# Patient Record
Sex: Male | Born: 1935 | Race: White | Hispanic: No | Marital: Married | Smoking: Former smoker
Health system: Southern US, Community
[De-identification: ages and names within clinical notes are randomized; demographics above are authoritative.]

## PROBLEM LIST (undated history)

## (undated) DIAGNOSIS — I4891 Unspecified atrial fibrillation: Secondary | ICD-10-CM

## (undated) DIAGNOSIS — I10 Essential (primary) hypertension: Secondary | ICD-10-CM

## (undated) DIAGNOSIS — H9190 Unspecified hearing loss, unspecified ear: Secondary | ICD-10-CM

## (undated) HISTORY — PX: LUNG BIOPSY: SHX232

---

## 1998-09-29 ENCOUNTER — Inpatient Hospital Stay (HOSPITAL_COMMUNITY): Admission: EM | Admit: 1998-09-29 | Discharge: 1998-10-03 | Payer: Self-pay | Admitting: Emergency Medicine

## 1998-10-11 ENCOUNTER — Encounter (HOSPITAL_COMMUNITY): Payer: Self-pay | Admitting: Oncology

## 1998-10-11 ENCOUNTER — Ambulatory Visit (HOSPITAL_COMMUNITY): Admission: RE | Admit: 1998-10-11 | Discharge: 1998-10-11 | Payer: Self-pay | Admitting: Oncology

## 2000-08-26 ENCOUNTER — Emergency Department (HOSPITAL_COMMUNITY): Admission: EM | Admit: 2000-08-26 | Discharge: 2000-08-26 | Payer: Self-pay | Admitting: Internal Medicine

## 2001-03-16 ENCOUNTER — Emergency Department (HOSPITAL_COMMUNITY): Admission: EM | Admit: 2001-03-16 | Discharge: 2001-03-16 | Payer: Self-pay | Admitting: Emergency Medicine

## 2001-03-16 ENCOUNTER — Encounter: Payer: Self-pay | Admitting: Emergency Medicine

## 2001-04-09 ENCOUNTER — Encounter: Admission: RE | Admit: 2001-04-09 | Discharge: 2001-04-09 | Payer: Self-pay | Admitting: *Deleted

## 2001-04-09 ENCOUNTER — Encounter: Payer: Self-pay | Admitting: *Deleted

## 2001-06-03 ENCOUNTER — Ambulatory Visit (HOSPITAL_COMMUNITY): Admission: RE | Admit: 2001-06-03 | Discharge: 2001-06-03 | Payer: Self-pay | Admitting: *Deleted

## 2006-04-15 ENCOUNTER — Encounter: Payer: Self-pay | Admitting: Emergency Medicine

## 2006-06-05 ENCOUNTER — Encounter: Admission: RE | Admit: 2006-06-05 | Discharge: 2006-06-05 | Payer: Self-pay | Admitting: Internal Medicine

## 2017-06-17 ENCOUNTER — Emergency Department (HOSPITAL_COMMUNITY): Payer: Medicare Other

## 2017-06-17 ENCOUNTER — Inpatient Hospital Stay (HOSPITAL_COMMUNITY)
Admission: EM | Admit: 2017-06-17 | Discharge: 2017-06-22 | DRG: 871 | Disposition: A | Discharge status: Skilled Nursing Facility | Payer: Medicare Other | Attending: Internal Medicine | Admitting: Internal Medicine

## 2017-06-17 ENCOUNTER — Encounter (HOSPITAL_COMMUNITY): Payer: Self-pay | Admitting: Obstetrics and Gynecology

## 2017-06-17 DIAGNOSIS — E871 Hypo-osmolality and hyponatremia: Secondary | ICD-10-CM | POA: Diagnosis present

## 2017-06-17 DIAGNOSIS — I10 Essential (primary) hypertension: Secondary | ICD-10-CM | POA: Diagnosis present

## 2017-06-17 DIAGNOSIS — J189 Pneumonia, unspecified organism: Secondary | ICD-10-CM | POA: Diagnosis present

## 2017-06-17 DIAGNOSIS — E872 Acidosis: Secondary | ICD-10-CM | POA: Diagnosis present

## 2017-06-17 DIAGNOSIS — K5900 Constipation, unspecified: Secondary | ICD-10-CM | POA: Diagnosis present

## 2017-06-17 DIAGNOSIS — R319 Hematuria, unspecified: Secondary | ICD-10-CM | POA: Diagnosis present

## 2017-06-17 DIAGNOSIS — Z66 Do not resuscitate: Secondary | ICD-10-CM | POA: Diagnosis present

## 2017-06-17 DIAGNOSIS — I248 Other forms of acute ischemic heart disease: Secondary | ICD-10-CM | POA: Diagnosis present

## 2017-06-17 DIAGNOSIS — Z79899 Other long term (current) drug therapy: Secondary | ICD-10-CM | POA: Diagnosis not present

## 2017-06-17 DIAGNOSIS — R778 Other specified abnormalities of plasma proteins: Secondary | ICD-10-CM | POA: Diagnosis present

## 2017-06-17 DIAGNOSIS — E876 Hypokalemia: Secondary | ICD-10-CM | POA: Diagnosis not present

## 2017-06-17 DIAGNOSIS — A419 Sepsis, unspecified organism: Secondary | ICD-10-CM | POA: Diagnosis present

## 2017-06-17 DIAGNOSIS — N390 Urinary tract infection, site not specified: Secondary | ICD-10-CM | POA: Diagnosis present

## 2017-06-17 DIAGNOSIS — R7989 Other specified abnormal findings of blood chemistry: Secondary | ICD-10-CM

## 2017-06-17 DIAGNOSIS — R748 Abnormal levels of other serum enzymes: Secondary | ICD-10-CM | POA: Diagnosis not present

## 2017-06-17 DIAGNOSIS — Z87891 Personal history of nicotine dependence: Secondary | ICD-10-CM

## 2017-06-17 DIAGNOSIS — N179 Acute kidney failure, unspecified: Secondary | ICD-10-CM | POA: Diagnosis present

## 2017-06-17 DIAGNOSIS — J9601 Acute respiratory failure with hypoxia: Secondary | ICD-10-CM

## 2017-06-17 DIAGNOSIS — J9621 Acute and chronic respiratory failure with hypoxia: Secondary | ICD-10-CM | POA: Diagnosis present

## 2017-06-17 DIAGNOSIS — E861 Hypovolemia: Secondary | ICD-10-CM | POA: Diagnosis present

## 2017-06-17 HISTORY — DX: Essential (primary) hypertension: I10

## 2017-06-17 LAB — CBC WITH DIFFERENTIAL/PLATELET
Basophils Absolute: 0 10*3/uL (ref 0.0–0.1)
Basophils Relative: 0 %
EOS ABS: 0 10*3/uL (ref 0.0–0.7)
EOS PCT: 0 %
HCT: 44 % (ref 39.0–52.0)
HEMOGLOBIN: 15.7 g/dL (ref 13.0–17.0)
LYMPHS ABS: 1.5 10*3/uL (ref 0.7–4.0)
LYMPHS PCT: 7 %
MCH: 32.6 pg (ref 26.0–34.0)
MCHC: 35.7 g/dL (ref 30.0–36.0)
MCV: 91.5 fL (ref 78.0–100.0)
MONOS PCT: 6 %
Monocytes Absolute: 1.2 10*3/uL — ABNORMAL HIGH (ref 0.1–1.0)
Neutro Abs: 18.2 10*3/uL — ABNORMAL HIGH (ref 1.7–7.7)
Neutrophils Relative %: 87 %
Platelets: 299 10*3/uL (ref 150–400)
RBC: 4.81 MIL/uL (ref 4.22–5.81)
RDW: 13 % (ref 11.5–15.5)
WBC: 20.9 10*3/uL — ABNORMAL HIGH (ref 4.0–10.5)

## 2017-06-17 LAB — COMPREHENSIVE METABOLIC PANEL
ALBUMIN: 3 g/dL — AB (ref 3.5–5.0)
ALT: 18 U/L (ref 17–63)
AST: 29 U/L (ref 15–41)
Alkaline Phosphatase: 132 U/L — ABNORMAL HIGH (ref 38–126)
Anion gap: 14 (ref 5–15)
BUN: 90 mg/dL — AB (ref 6–20)
CHLORIDE: 99 mmol/L — AB (ref 101–111)
CO2: 19 mmol/L — AB (ref 22–32)
CREATININE: 3.04 mg/dL — AB (ref 0.61–1.24)
Calcium: 8.7 mg/dL — ABNORMAL LOW (ref 8.9–10.3)
GFR calc Af Amer: 19 mL/min — ABNORMAL LOW (ref >60)
GFR calc non Af Amer: 16 mL/min — ABNORMAL LOW (ref >60)
Glucose, Bld: 109 mg/dL — ABNORMAL HIGH (ref 65–99)
POTASSIUM: 3.6 mmol/L (ref 3.5–5.1)
SODIUM: 132 mmol/L — AB (ref 135–145)
Total Bilirubin: 1.3 mg/dL — ABNORMAL HIGH (ref 0.3–1.2)
Total Protein: 7.4 g/dL (ref 6.5–8.1)

## 2017-06-17 LAB — I-STAT TROPONIN, ED: TROPONIN I, POC: 0.1 ng/mL — AB (ref 0.00–0.08)

## 2017-06-17 LAB — URINALYSIS, MICROSCOPIC (REFLEX): SQUAMOUS EPITHELIAL / LPF: NONE SEEN

## 2017-06-17 LAB — URINALYSIS, ROUTINE W REFLEX MICROSCOPIC
Glucose, UA: NEGATIVE mg/dL
KETONES UR: NEGATIVE mg/dL
NITRITE: NEGATIVE
PH: 5 (ref 5.0–8.0)
PROTEIN: 100 mg/dL — AB
Specific Gravity, Urine: 1.02 (ref 1.005–1.030)

## 2017-06-17 LAB — TROPONIN I: Troponin I: 0.05 ng/mL (ref <0.03)

## 2017-06-17 LAB — LACTIC ACID, PLASMA: Lactic Acid, Venous: 1.7 mmol/L (ref 0.5–1.9)

## 2017-06-17 LAB — I-STAT CG4 LACTIC ACID, ED: LACTIC ACID, VENOUS: 2 mmol/L — AB (ref 0.5–1.9)

## 2017-06-17 LAB — BRAIN NATRIURETIC PEPTIDE: B Natriuretic Peptide: 744.7 pg/mL — ABNORMAL HIGH (ref 0.0–100.0)

## 2017-06-17 MED ORDER — DEXTROSE 5 % IV SOLN
500.0000 mg | INTRAVENOUS | Status: DC
  Administered 2017-06-17 – 2017-06-19 (×3): 500 mg via INTRAVENOUS
  Filled 2017-06-17 (×3): qty 500

## 2017-06-17 MED ORDER — VANCOMYCIN HCL IN DEXTROSE 1-5 GM/200ML-% IV SOLN
1000.0000 mg | Freq: Once | INTRAVENOUS | Status: AC
  Administered 2017-06-17: 1000 mg via INTRAVENOUS
  Filled 2017-06-17: qty 200

## 2017-06-17 MED ORDER — SODIUM CHLORIDE 0.9 % IV BOLUS (SEPSIS)
1000.0000 mL | Freq: Once | INTRAVENOUS | Status: AC
  Administered 2017-06-17: 1000 mL via INTRAVENOUS

## 2017-06-17 MED ORDER — PIPERACILLIN-TAZOBACTAM IN DEX 2-0.25 GM/50ML IV SOLN
2.2500 g | Freq: Three times a day (TID) | INTRAVENOUS | Status: DC
  Filled 2017-06-17: qty 50

## 2017-06-17 MED ORDER — IPRATROPIUM-ALBUTEROL 0.5-2.5 (3) MG/3ML IN SOLN
RESPIRATORY_TRACT | Status: AC
  Filled 2017-06-17: qty 3

## 2017-06-17 MED ORDER — IPRATROPIUM-ALBUTEROL 0.5-2.5 (3) MG/3ML IN SOLN
3.0000 mL | Freq: Once | RESPIRATORY_TRACT | Status: AC
  Administered 2017-06-17: 3 mL via RESPIRATORY_TRACT

## 2017-06-17 MED ORDER — PIPERACILLIN-TAZOBACTAM 3.375 G IVPB 30 MIN
3.3750 g | Freq: Once | INTRAVENOUS | Status: AC
  Administered 2017-06-17: 3.375 g via INTRAVENOUS
  Filled 2017-06-17: qty 50

## 2017-06-17 MED ORDER — DEXTROSE 5 % IV SOLN
1.0000 g | INTRAVENOUS | Status: DC
  Administered 2017-06-17 – 2017-06-21 (×4): 1 g via INTRAVENOUS
  Filled 2017-06-17 (×7): qty 10

## 2017-06-17 MED ORDER — ACETAMINOPHEN 500 MG PO TABS
1000.0000 mg | ORAL_TABLET | Freq: Once | ORAL | Status: AC
Start: 2017-06-17 — End: 2017-06-17
  Administered 2017-06-17: 1000 mg via ORAL
  Filled 2017-06-17: qty 2

## 2017-06-17 MED ORDER — HEPARIN SODIUM (PORCINE) 5000 UNIT/ML IJ SOLN
5000.0000 [IU] | Freq: Three times a day (TID) | INTRAMUSCULAR | Status: DC
  Administered 2017-06-17 – 2017-06-21 (×13): 5000 [IU] via SUBCUTANEOUS
  Filled 2017-06-17 (×14): qty 1

## 2017-06-17 MED ORDER — VANCOMYCIN HCL IN DEXTROSE 1-5 GM/200ML-% IV SOLN: 1000.0000 mg | INTRAVENOUS | Status: DC

## 2017-06-17 MED ORDER — SODIUM CHLORIDE 0.9 % IV SOLN
INTRAVENOUS | Status: DC
  Administered 2017-06-17: 21:00:00 via INTRAVENOUS

## 2017-06-17 NOTE — ED Notes (Signed)
Respiratory at bedside.

## 2017-06-17 NOTE — Progress Notes (Signed)
CRITICAL VALUE ALERT  Critical Value:  Troponin 0.05  Date & Time Notied:  06/17/17 2310  Provider Notified: Kirtland BouchardK. Schorr  Orders Received/Actions taken: none given, will continue to monitor

## 2017-06-17 NOTE — Progress Notes (Signed)
Pharmacy Antibiotic Note  Royanne FootsLucius W Inda is a 83 y.o. male admitted on 06/17/2017 with sepsis.  Presenting complaint was shortness of breath.  Pharmacy has been consulted for vancomycin and zosyn dosing.  Vanc 1g x 1 and Zosyn 3.375g x 1 given as first time doses in the ED.  SCr 3.04, CrCl~14 ml/min CXR: Bibasilar airspace disease, left greater than right, suspicious for pneumonia.  Plan: Zosyn 2.25g IV q8h. Vancomycin 1g IV q48h. Daily SCr.  Weight: 148 lb (67.1 kg)  Temp (24hrs), Avg:102 F (38.9 C), Min:102 F (38.9 C), Max:102 F (38.9 C)   Recent Labs Lab 06/17/17 1708 06/17/17 1715  WBC 20.9*  --   CREATININE 3.04*  --   LATICACIDVEN  --  2.00*    CrCl cannot be calculated (Unknown ideal weight.).    Not on File  Antimicrobials this admission: 7/4 vancomycin >>  7/4 zosyn >>   Dose adjustments this admission:  Microbiology results: 7/4 BCx:  7/4 UCx:    Thank you for allowing pharmacy to be a part of this patient's care.  Clance BollRunyon, Quinita Kostelecky 06/17/2017 6:06 PM

## 2017-06-17 NOTE — ED Triage Notes (Signed)
Per EMS:  Pt is coming from home. Pt reports that he had a cold since Friday. Pt reports feeling SOB and having difficulty getting a good breath.  Pt's son told EMS on scene that he is a DNR  Pt alert and oriented X4.  Last Vitals:  132/90 93 on 15 L non-rebreather mask 90 HR

## 2017-06-17 NOTE — ED Notes (Signed)
Hospitalist at bedside 

## 2017-06-17 NOTE — ED Provider Notes (Signed)
[facility]-EMERGENCY DEPT Provider Note   CSN: [id number] Arrival date & time: 06/17/17  1642     History   Chief Complaint Chief Complaint  Patient presents with  . Shortness of Breath    HPI Clayton Hoffman is a 83 y.o. male.  83 year old male with unknown past medical history who presents from home with respiratory distress. Patient reported to EMS that he began having cold symptoms including cough 5 days ago. He has had progressively worsening shortness of breath. He states that he fell a while back and has recently been having pains on both sides of his chest and in his back, he is not sure whether the pains are related to the fall or not. Pain is worse with deep inspiration. He denies any significant pain currently. He's had mild diarrhea but no fevers or vomiting. He denies smoking history or history of COPD. Son told EMS that he is DNR.  LEVEL 5 CAVEAT DUE TO RESPIRATORY DISTRESS   The history is provided by the patient and the EMS personnel.  Shortness of Breath     No past medical history on file.  There are no active problems to display for this patient.   No past surgical history on file.     Home Medications    Prior to Admission medications   Not on File    Family History No family history on file.  Social History Social History  Substance Use Topics  . Smoking status: Not on file  . Smokeless tobacco: Not on file  . Alcohol use Not on file     Allergies   Patient has no allergy information on record.   Review of Systems Review of Systems  Unable to perform ROS: Severe respiratory distress  Respiratory: Positive for shortness of breath.      Physical Exam Updated Vital Signs SpO2 93%   Physical Exam  Constitutional: He is oriented to person, place, and time. He appears well-developed and well-nourished. He appears distressed.  Thin, frail man in respiratory distress  HENT:  Head: Normocephalic and atraumatic.  Eyes: Conjunctivae are  normal. Pupils are equal, round, and reactive to light.  Neck: Neck supple.  Cardiovascular: Normal rate, regular rhythm and normal heart sounds.   No murmur heard. Pulmonary/Chest: He is in respiratory distress. He has no wheezes.  Diminished BS b/l, no obvious rales, tachypnea with accessory muscle use, unable to speak in full sentences  Abdominal: Soft. Bowel sounds are normal. He exhibits no distension. There is no tenderness.  Musculoskeletal: He exhibits no edema.  Neurological: He is alert and oriented to person, place, and time.  Fluent speech  Skin: Skin is warm and dry.  Psychiatric: He has a normal mood and affect.  Nursing note and vitals reviewed.    ED Treatments / Results  Labs (all labs ordered are listed, but only abnormal results are displayed) Labs Reviewed  COMPREHENSIVE METABOLIC PANEL - Abnormal; Notable for the following:       Result Value   Sodium 132 (*)    Chloride 99 (*)    CO2 19 (*)    Glucose, Bld 109 (*)    BUN 90 (*)    Creatinine, Ser 3.04 (*)    Calcium 8.7 (*)    Albumin 3.0 (*)    Alkaline Phosphatase 132 (*)    Total Bilirubin 1.3 (*)    GFR calc non Af Amer 16 (*)    GFR calc Af Amer 19 (*)  All other components within normal limits  CBC WITH DIFFERENTIAL/PLATELET - Abnormal; Notable for the following:    WBC 20.9 (*)    Neutro Abs 18.2 (*)    Monocytes Absolute 1.2 (*)    All other components within normal limits  URINALYSIS, ROUTINE W REFLEX MICROSCOPIC - Abnormal; Notable for the following:    Color, Urine BROWN (*)    APPearance TURBID (*)    Hgb urine dipstick LARGE (*)    Bilirubin Urine SMALL (*)    Protein, ur 100 (*)    Leukocytes, UA LARGE (*)    All other components within normal limits  URINALYSIS, MICROSCOPIC (REFLEX) - Abnormal; Notable for the following:    Bacteria, UA MANY (*)    All other components within normal limits  I-STAT TROPOININ, ED - Abnormal; Notable for the following:    Troponin i, poc 0.10 (*)     All other components within normal limits  I-STAT CG4 LACTIC ACID, ED - Abnormal; Notable for the following:    Lactic Acid, Venous 2.00 (*)    All other components within normal limits  CULTURE, BLOOD (ROUTINE X 2)  CULTURE, BLOOD (ROUTINE X 2)  URINE CULTURE  BLOOD GAS, ARTERIAL  BRAIN NATRIURETIC PEPTIDE  CREATININE, SERUM  I-STAT CG4 LACTIC ACID, ED    EKG  EKG Interpretation  Date/Time:  Wednesday June 17 2017 16:54:49 EDT Ventricular Rate:  94 PR Interval:    QRS Duration: 96 QT Interval:  349 QTC Calculation: 437 R Axis:   13 Text Interpretation:  Sinus tachycardia Atrial premature complex Borderline ST depression, diffuse leads Baseline wander in lead(s) I III aVL Interpretation limited secondary to artifact Confirmed by Frederick Peers ([id number]) on 06/17/2017 5:07:24 PM       Radiology Dg Chest Port 1 View  Result Date: 06/17/2017 CLINICAL DATA:  Shortness of breath EXAM: PORTABLE CHEST 1 VIEW COMPARISON:  None. FINDINGS: 1656 hours. Low lung volumes. There is bibasilar airspace disease, left greater than right. The cardiopericardial silhouette is within normal limits for size. Bones are diffusely demineralized. Telemetry leads overlie the chest. IMPRESSION: Bibasilar airspace disease, left greater than right, suspicious for pneumonia. Follow-up imaging recommended to ensure resolution. Electronically Signed   By: Kennith Center M.D.   On: 06/17/2017 17:19    Procedures .Critical Care Performed by: Laurence Spates Authorized by: Laurence Spates   Critical care provider statement:    Critical care time (minutes):  40   Critical care time was exclusive of:  Separately billable procedures and treating other patients   Critical care was necessary to treat or prevent imminent or life-threatening deterioration of the following conditions:  Sepsis and respiratory failure   Critical care was time spent personally by me on the following activities:  Development of  treatment plan with patient or surrogate, evaluation of patient's response to treatment, examination of patient, obtaining history from patient or surrogate, ordering and performing treatments and interventions, ordering and review of laboratory studies, ordering and review of radiographic studies and re-evaluation of patient's condition   (including critical care time)  Medications Ordered in ED Medications  vancomycin (VANCOCIN) IVPB 1000 mg/200 mL premix (1,000 mg Intravenous New Bag/Given 06/17/17 1754)  ipratropium-albuterol (DUONEB) 0.5-2.5 (3) MG/3ML nebulizer solution (  Not Given 06/17/17 1718)  vancomycin (VANCOCIN) IVPB 1000 mg/200 mL premix (not administered)  piperacillin-tazobactam (ZOSYN) IVPB 2.25 g (not administered)  piperacillin-tazobactam (ZOSYN) IVPB 3.375 g (0 g Intravenous Stopped 06/17/17 1753)  sodium chloride 0.9 % bolus  1,000 mL (1,000 mLs Intravenous New Bag/Given 06/17/17 1724)  ipratropium-albuterol (DUONEB) 0.5-2.5 (3) MG/3ML nebulizer solution 3 mL (3 mLs Nebulization Given 06/17/17 1716)  acetaminophen (TYLENOL) tablet 1,000 mg (1,000 mg Oral Given 06/17/17 1804)  sodium chloride 0.9 % bolus 1,000 mL (1,000 mLs Intravenous New Bag/Given 06/17/17 1803)     Initial Impression / Assessment and Plan / ED Course  I have reviewed the triage vital signs and the nursing notes.  Pertinent labs & imaging results that were available during my care of the patient were reviewed by me and considered in my medical decision making (see chart for details).     PT brought in by EMS for Shortness of breath, he was hypoxic and placed on a nonrebreather on arrival. Immediately switched to BiPAP. T 102, BP normal. Initiated a code sepsis including blood and urine cultures, vancomycin and Zosyn, IV fluid bolus.  Initial lactate was 2, troponin 0.1 which I suspect may be heart strain secondary to respiratory process as the patient does not have any chest pain. Creatinine elevated at 3.04, BUN  90. No previous available for comparison. WBC 20.9. His urine is consistent with infection and his chest x-ray also is suggestive of pneumonia. On reexamination, he is breathing more comfortably on BiPAP and we have been able to titrate down his FiO2. Son updated on findings and plan. Son confirms he is DNR. Discussed admission with hospitalist, Dr. Antionette Charpyd, and pt admitted for further treatment.   Final Clinical Impressions(s) / ED Diagnoses   Final diagnoses:  None    New Prescriptions New Prescriptions   No medications on file     Loyalty Arentz, Ambrose Finlandachel Morgan, MD 06/17/17 [phone]

## 2017-06-17 NOTE — ED Notes (Signed)
Bed: [id number] Expected date:  Expected time:  Means of arrival:  Comments: 83 yo m shortness of breath

## 2017-06-17 NOTE — ED Notes (Signed)
X-ray at bedside

## 2017-06-17 NOTE — Progress Notes (Signed)
Pt removed from BIPAP and placed on 10 LPM [place] .  Pt tolerating well at this time, RT to monitor and assess as needed.

## 2017-06-17 NOTE — H&P (Signed)
History and Physical    Clayton Hoffman ZOX:[id number] DOB: 03/31/1922 DOA: 06/17/2017  PCP: Patient, [name]   Patient coming from: Home  Chief Complaint: Fevers, SOB, cough, lethargy   HPI: Clayton FootsLucius W Hoffman is a 83 y.o. male with medical history significant for hypertension, now presenting to the emergency department with fevers, shortness of breath, productive cough, and lethargy. Patient is accompanied by his son who assist with the history. Patient had reportedly been in his usual state of health until proximately 5 days ago when he noted the insidious development of a cough with mild dyspnea. These symptoms gradually progressed over the ensuing days and he also developed intermittent fevers. His cough has become productive of white, clear, and yellow sputum. He has become increasingly short of breath and fatigued. His primary care physician when out to his house yesterday, wrote some prescriptions, and planned to see him for a short-term follow-up. His son reports that he seemed to improve some yesterday afternoon briefly, but then re-worsened today. His son went to go get the prescriptions filled today, but upon returning, found the patient to be struggling to catch his breath, prompting his son to call 911. Patient denies any chest pain or palpitations, but notes some low back pain that had developed a couple weeks ago and has been intermittent since then. He also reports some lower abdominal discomfort. Denies headache, change in vision or hearing, or focal numbness or weakness. Patient denies any history of kidney disease, COPD, or coronary artery disease. His son at the bedside confirms this. At his baseline, the patient reportedly continues to drive and handle all of his ADLs independently.  ED Course: Upon arrival to the ED, patient is found to be febrile to 38.9 C, saturating 81% on room air, tachypneic, slightly tachycardic, and was stable blood pressure. EKG features a sinus rhythm  with PACs and nonspecific repolarization abnormality. Chest x-ray is notable for bibasilar airspace disease, left greater than right, suspicious for pneumonia. Chemistry panel reveals a sodium of 132, bicarbonate of 19, BUN of 90, and serum creatinine of 3.04 with no prior chemistry panel available for comparison. CBC features a leukocytosis to 20,900, lactic acid is elevated to 2.00, troponin is mildly elevated to 0.10, BNP is also elevated to 745. Urinalysis is highly suggestive of infection and hemoglobin is noted on the dipstick. Urine and blood were sent for cultures, 2 L of normal saline were given, and the patient was treated with empiric vancomycin and Zosyn. He was also given acetaminophen and DuoNeb in the ED and started on BiPAP. Patient has remained hemodynamically stable and seems to be improving from a respiratory perspective on BiPAP in the ED. He will be admitted to the stepdown unit for ongoing evaluation and management of sepsis suspected secondary to pneumonia, and with UTI.  Review of Systems:  All other systems reviewed and apart from HPI, are negative.  Past Medical History:  Diagnosis Date  . Hypertension     Past Surgical History:  Procedure Laterality Date  . LUNG BIOPSY       reports that he has quit smoking. His smoking use included Cigarettes. He quit after 40.00 years of use. He does not have any smokeless tobacco history on file. He reports that he does not drink alcohol or use drugs.  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  History reviewed. No pertinent family history.   Prior to Admission medications   Not on File    Physical Exam: Vitals:   06/17/17 1830 06/17/17 1900 06/17/17 1906 06/17/17 1930  BP: (!) 116/57 (!) 116/55 112/62 (!) 102/47  Pulse: 87 87 89 78  Resp:  (!) 26 (!) 30 (!) 24 17  Temp:   (!) 100.9 F (38.3 C)   TempSrc:   Rectal   SpO2: 97% 98% 99% 97%  Weight:          Constitutional: Increased WOB, no pallor or diaphoresis. Appears lethargic. Greets with smile.  Eyes: PERTLA, lids and conjunctivae normal ENMT: Mucous membranes are moist. Posterior pharynx clear of any exudate or lesions.   Neck: normal, supple, no masses, no thyromegaly Respiratory: Coarse rhonchi bilateral bases. Accessory muscle recruitment. Speaks two-words between breaths. No pallor or cyanosis.  Cardiovascular: S1 & S2 heard, regular rate and rhythm. No significant JVD. Abdomen: No distension, suprapubic tenderness, no masses palpated. Bowel sounds active.  Musculoskeletal: no clubbing / cyanosis. No joint deformity upper and lower extremities.   Skin: no significant rashes, lesions, ulcers. Poor turgor. Neurologic: CN 2-12 grossly intact. Sensation intact, DTR normal. Strength 5/5 in all 4 limbs.  Psychiatric: Alert and oriented x 3. Pleasant and cooperative.     Labs on Admission: I have personally reviewed following labs and imaging studies  CBC:  Recent Labs Lab 06/17/17 1708  WBC 20.9*  NEUTROABS 18.2*  HGB 15.7  HCT 44.0  MCV 91.5  PLT 299   Basic Metabolic Panel:  Recent Labs Lab 06/17/17 1708  NA 132*  K 3.6  CL 99*  CO2 19*  GLUCOSE 109*  BUN 90*  CREATININE 3.04*  CALCIUM 8.7*   GFR: CrCl cannot be calculated (Unknown ideal weight.). Liver Function Tests:  Recent Labs Lab 06/17/17 1708  AST 29  ALT 18  ALKPHOS 132*  BILITOT 1.3*  PROT 7.4  ALBUMIN 3.0*   No results for input(s): LIPASE, AMYLASE in the last 168 hours. No results for input(s): AMMONIA in the last 168 hours. Coagulation Profile: No results for input(s): INR, PROTIME in the last 168 hours. Cardiac Enzymes: No results for input(s): CKTOTAL, CKMB, CKMBINDEX, TROPONINI in the last 168 hours. BNP (last 3 results) No results for input(s): PROBNP in the  last 8760 hours. HbA1C: No results for input(s): HGBA1C in the last 72 hours. CBG: No results for input(s): GLUCAP in the last 168 hours. Lipid Profile: No results for input(s): CHOL, HDL, LDLCALC, TRIG, CHOLHDL, LDLDIRECT in the last 72 hours. Thyroid Function Tests: No results for input(s): TSH, T4TOTAL, FREET4, T3FREE, THYROIDAB in the last 72 hours. Anemia Panel: No results for input(s): VITAMINB12, FOLATE, FERRITIN, TIBC, IRON, RETICCTPCT in the last 72 hours. Urine analysis:    Component Value Date/Time   COLORURINE BROWN (A) 06/17/2017 1700   APPEARANCEUR TURBID (A) 06/17/2017 1700   LABSPEC 1.020 06/17/2017 1700   PHURINE 5.0 06/17/2017 1700   GLUCOSEU NEGATIVE 06/17/2017 1700   HGBUR LARGE (A) 06/17/2017 1700   BILIRUBINUR SMALL (A) 06/17/2017 1700   KETONESUR NEGATIVE 06/17/2017 1700   PROTEINUR 100 (A) 06/17/2017 1700   NITRITE NEGATIVE 06/17/2017 1700   LEUKOCYTESUR LARGE (A) 06/17/2017 1700   Sepsis Labs: @LABRCNTIP (procalcitonin:4,lacticidven:4) )No results found for this or any previous visit (from the past 240 hour(s)).   Radiological Exams on Admission: Dg Chest Port 1 View  Result Date: 06/17/2017 CLINICAL DATA:  Shortness  of breath EXAM: PORTABLE CHEST 1 VIEW COMPARISON:  None. FINDINGS: 1656 hours. Low lung volumes. There is bibasilar airspace disease, left greater than right. The cardiopericardial silhouette is within normal limits for size. Bones are diffusely demineralized. Telemetry leads overlie the chest. IMPRESSION: Bibasilar airspace disease, left greater than right, suspicious for pneumonia. Follow-up imaging recommended to ensure resolution. Electronically Signed   By: Kennith Center M.D.   On: 06/17/2017 17:19    EKG: Independently reviewed. Sinus rhythm, PAC, non-specific repolarization abnormality, no prior available.   Assessment/Plan  1. Sepsis secondary to PNA, UTI - Pt presents with 5 days of progressive cough, SOB, fevers, lethargy - He is  found to be hypoxic, with marked leukocytosis, AKI, bibasilar consolidations on CXR, and suprapubic tenderness with UA highly suggestive of infection   - Blood and urine cultures were collected in ED and sputum culture with gram stain is requesetd - He was given 2 liters NS and started on empiric vancomycin and cefepime  - He is requiring BiPAP in ED  - Plan to continue empiric abx with Rocephin, azithromycin, and vancomycin while following culture data and clinical course  - Wean BiPAP as tolerated  2. Acute hypoxic respiratory failure  - Denies hx of preexisting lung disease  - Saturating 81% in ED on rm air, placed on BiPAP, will wean as tolerated  - Secondary to PNA, managing as above  3. Acute renal failure  - Pt denies hx of CKD, son at bedside has never heard of pt having kidney disease  - SCr is 3.04 on admission with no prior chem panel available  - Suspect this is acute and secondary to severe sepsis from PNA/UTI  - Will need to exclude an obstructive process, particularly given the purulent urine  - 2 liters NS given in ED, will continue NS infusion  - Check renal US, avoid nephrotoxic agents, renally-dose medications, and repeat chem panel in am   4. Elevated troponin - Troponin is elevated to 0.10 on admission with no angina or acute ischemic features on EKG  - Likely represents a demand ischemia in the setting of severe sepsis  - Plan to monitor on telemetry, trend troponin measurements, and repeat EKG in am and prn    5. Hyponatremia  - Serum sodium is 132 in setting of hypovolemia  - 2 liters NS given in ED and NS infusion continued  - Repeat chem panel in am     DVT prophylaxis: sq heparin Code Status: DNR Family Communication: Son updated at bedside Disposition Plan: Admit to SDU Consults called: None Admission status: Inpatient    Briscoe Deutscher, MD [facility] Pager [phone]  If 7PM-7AM, please contact night-coverage [email or web address] Password  [facility]  06/17/2017, 7:54 PM

## 2017-06-17 NOTE — Progress Notes (Signed)
Pt transported from ED to ICU 1231 on bipap.  Pt tolerated transport well without incident. 

## 2017-06-18 ENCOUNTER — Encounter (HOSPITAL_COMMUNITY): Payer: Self-pay

## 2017-06-18 ENCOUNTER — Inpatient Hospital Stay (HOSPITAL_COMMUNITY): Payer: Medicare Other

## 2017-06-18 DIAGNOSIS — A419 Sepsis, unspecified organism: Principal | ICD-10-CM

## 2017-06-18 DIAGNOSIS — N179 Acute kidney failure, unspecified: Secondary | ICD-10-CM

## 2017-06-18 DIAGNOSIS — J9601 Acute respiratory failure with hypoxia: Secondary | ICD-10-CM

## 2017-06-18 DIAGNOSIS — J189 Pneumonia, unspecified organism: Secondary | ICD-10-CM

## 2017-06-18 LAB — MRSA PCR SCREENING: MRSA BY PCR: NEGATIVE

## 2017-06-18 LAB — CBC WITH DIFFERENTIAL/PLATELET
Basophils Absolute: 0 10*3/uL (ref 0.0–0.1)
Basophils Relative: 0 %
EOS PCT: 0 %
Eosinophils Absolute: 0 10*3/uL (ref 0.0–0.7)
HCT: 34.7 % — ABNORMAL LOW (ref 39.0–52.0)
Hemoglobin: 12.4 g/dL — ABNORMAL LOW (ref 13.0–17.0)
LYMPHS ABS: 1.3 10*3/uL (ref 0.7–4.0)
LYMPHS PCT: 7 %
MCH: 32 pg (ref 26.0–34.0)
MCHC: 35.7 g/dL (ref 30.0–36.0)
MCV: 89.4 fL (ref 78.0–100.0)
MONO ABS: 1.1 10*3/uL — AB (ref 0.1–1.0)
MONOS PCT: 6 %
Neutro Abs: 15.9 10*3/uL — ABNORMAL HIGH (ref 1.7–7.7)
Neutrophils Relative %: 87 %
PLATELETS: 215 10*3/uL (ref 150–400)
RBC: 3.88 MIL/uL — ABNORMAL LOW (ref 4.22–5.81)
RDW: 12.9 % (ref 11.5–15.5)
WBC: 18.2 10*3/uL — ABNORMAL HIGH (ref 4.0–10.5)

## 2017-06-18 LAB — TROPONIN I: TROPONIN I: 0.03 ng/mL — AB (ref <0.03)

## 2017-06-18 LAB — COMPREHENSIVE METABOLIC PANEL
ALT: 13 U/L — AB (ref 17–63)
ANION GAP: 11 (ref 5–15)
AST: 17 U/L (ref 15–41)
Albumin: 2.2 g/dL — ABNORMAL LOW (ref 3.5–5.0)
Alkaline Phosphatase: 80 U/L (ref 38–126)
BUN: 83 mg/dL — ABNORMAL HIGH (ref 6–20)
CHLORIDE: 105 mmol/L (ref 101–111)
CO2: 18 mmol/L — ABNORMAL LOW (ref 22–32)
CREATININE: 2.67 mg/dL — AB (ref 0.61–1.24)
Calcium: 7.7 mg/dL — ABNORMAL LOW (ref 8.9–10.3)
GFR, EST AFRICAN AMERICAN: 22 mL/min — AB (ref >60)
GFR, EST NON AFRICAN AMERICAN: 19 mL/min — AB (ref >60)
Glucose, Bld: 104 mg/dL — ABNORMAL HIGH (ref 65–99)
Potassium: 3.1 mmol/L — ABNORMAL LOW (ref 3.5–5.1)
Sodium: 134 mmol/L — ABNORMAL LOW (ref 135–145)
Total Bilirubin: 1.3 mg/dL — ABNORMAL HIGH (ref 0.3–1.2)
Total Protein: 5.7 g/dL — ABNORMAL LOW (ref 6.5–8.1)

## 2017-06-18 LAB — STREP PNEUMONIAE URINARY ANTIGEN: Strep Pneumo Urinary Antigen: NEGATIVE

## 2017-06-18 MED ORDER — ASPIRIN EC 81 MG PO TBEC
81.0000 mg | DELAYED_RELEASE_TABLET | Freq: Every day | ORAL | Status: DC
  Administered 2017-06-18 – 2017-06-22 (×5): 81 mg via ORAL
  Filled 2017-06-18 (×5): qty 1

## 2017-06-18 MED ORDER — ORAL CARE MOUTH RINSE
15.0000 mL | Freq: Two times a day (BID) | OROMUCOSAL | Status: DC
  Administered 2017-06-18 – 2017-06-22 (×7): 15 mL via OROMUCOSAL

## 2017-06-18 MED ORDER — HYDROCODONE-ACETAMINOPHEN 5-325 MG PO TABS
0.5000 | ORAL_TABLET | Freq: Two times a day (BID) | ORAL | Status: DC | PRN
  Administered 2017-06-19: 0.5 via ORAL
  Filled 2017-06-18: qty 1

## 2017-06-18 MED ORDER — ATENOLOL 50 MG PO TABS
50.0000 mg | ORAL_TABLET | Freq: Every day | ORAL | Status: DC
  Administered 2017-06-18 – 2017-06-22 (×5): 50 mg via ORAL
  Filled 2017-06-18: qty 2
  Filled 2017-06-18 (×3): qty 1
  Filled 2017-06-18: qty 2

## 2017-06-18 MED ORDER — SODIUM CHLORIDE 0.9 % IV SOLN
INTRAVENOUS | Status: DC
  Administered 2017-06-18 – 2017-06-20 (×4): via INTRAVENOUS

## 2017-06-18 NOTE — Progress Notes (Signed)
Pt seen, sitting up in chair, no distress noted or voiced by pt at this time.  Pt remains on 5lnc, HR90, rr23, spo2 94%.  Bipap in room on standby but not indicated at this time.

## 2017-06-18 NOTE — Evaluation (Signed)
Clinical/Bedside Swallow Evaluation Patient Details  Name: Clayton Hoffman MRN: [id number] Date of Birth: 12/21/1921  Today's Date: 06/18/2017 Time: SLP Start Time (ACUTE ONLY): [id number] SLP Stop Time (ACUTE ONLY): 1002 SLP Time Calculation (min) (ACUTE ONLY): 19 min  Past Medical History:  Past Medical History:  Diagnosis Date  . Hypertension    Past Surgical History:  Past Surgical History:  Procedure Laterality Date  . LUNG BIOPSY     HPI:  Clayton BussingLucius W Coldronis a 83 y.o.malewith medical history significant for hypertension presented to the emergency department with fevers, shortness of breath, productive cough, and lethargy. Chest x-ray is notable for bibasilar airspace disease, left greater than right, suspicious for pneumonia. Per MD Urinalysis is highly suggestive of infection. No prior ST notes found.   Assessment / Plan / Recommendation Clinical Impression  Subtle signs of possible airway intrusion marked by wet vocal quality x 1 which pt spontaneously and effectively cleared and one delayed throat clear using straw. Cup sips thin mitigated findings and pt states "swallowing better with cup than straw." Pt reports coughing with each sip liquid via cup at home x 1 week which mitigated "with a straw." He denied prior esophageal difficulties. Recommend regular texture, thin liquids, pills whole in puree, NO straws, sip upright. He may need an objective study to fully assess swallow function and assist to modify if needed. Will plan to follow up tomorrow.     SLP Visit Diagnosis: Dysphagia, unspecified (R13.10)    Aspiration Risk   (mild-mod)    Diet Recommendation Regular;Thin liquid   Liquid Administration via: Cup;No straw Medication Administration: Whole meds with puree Supervision: Patient able to self feed;Intermittent supervision to cue for compensatory strategies Compensations: Slow rate;Small sips/bites (rest breaks if needed) Postural Changes: Seated upright at 90  degrees;Remain upright for at least 30 minutes after po intake    Other  Recommendations Oral Care Recommendations: Oral care BID   Follow up Recommendations  (TBD)      Frequency and Duration min 2x/week  2 weeks       Prognosis Prognosis for Safe Diet Advancement:  (FAIR-GOOD)      Swallow Study   General HPI: Raife W Coldronis a 83 y.o.malewith medical history significant for hypertension presented to the emergency department with fevers, shortness of breath, productive cough, and lethargy. Chest x-ray is notable for bibasilar airspace disease, left greater than right, suspicious for pneumonia. Per MD Urinalysis is highly suggestive of infection. No prior ST notes found. Type of Study: Bedside Swallow Evaluation Previous Swallow Assessment:  (none) Diet Prior to this Study: NPO Temperature Spikes Noted: No Respiratory Status:  (HFNC) History of Recent Intubation: No Behavior/Cognition: Alert;Cooperative;Pleasant mood Oral Cavity Assessment: Within Functional Limits Oral Care Completed by SLP: No Oral Cavity - Dentition: Dentures, top (natural lower) Vision: Functional for self-feeding Self-Feeding Abilities: Able to feed self Patient Positioning: Upright in chair Baseline Vocal Quality: Normal Volitional Cough: Strong Volitional Swallow: Able to elicit    Oral/Motor/Sensory Function Overall Oral Motor/Sensory Function: Within functional limits   Ice Chips Ice chips: Not tested   Thin Liquid Thin Liquid: Impaired Presentation: Straw;Cup Oral Phase Impairments:  (none) Oral Phase Functional Implications:  (none) Pharyngeal  Phase Impairments: Throat Clearing - Delayed;Wet Vocal Quality    Nectar Thick Nectar Thick Liquid: Not tested   Honey Thick Honey Thick Liquid: Not tested   Puree Puree: Within functional limits   Solid   GO   Solid: Within functional limits  [name] 06/18/2017,10:17 AM  [name].Ed [employer]  [phone]

## 2017-06-18 NOTE — Progress Notes (Signed)
PROGRESS NOTE    Clayton Hoffman  ZOX:[id number] DOB: May 08, 1922 DOA: 06/17/2017 PCP: Patient, [name]   Brief Narrative: Clayton Hoffman is a 83 y.o. male with medical history significant for hypertension, now presenting to the emergency department with fevers, shortness of breath, productive cough, and lethargy.   Upon arrival to the ED, patient is found to be febrile to 38.9 C, saturating 81% on room air, tachypneic, slightly tachycardic, and was stable blood pressure. EKG features a sinus rhythm with PACs and nonspecific repolarization abnormality. Chest x-ray is notable for bibasilar airspace disease, left greater than right, suspicious for pneumonia. Chemistry panel reveals a sodium of 132, bicarbonate of 19, BUN of 90, and serum creatinine of 3.04 with no prior chemistry panel available for comparison. CBC features a leukocytosis to 20,900, lactic acid is elevated to 2.00, troponin is mildly elevated to 0.10, BNP is also elevated to 745. Urinalysis is highly suggestive of infection   Assessment & Plan:   Principal Problem:   Sepsis due to pneumonia [facility]) Active Problems:   Acute on chronic respiratory failure with hypoxia (HCC)   Urinary tract infection with hematuria   Hyponatremia   Elevated troponin   Acute renal failure (ARF) (HCC)   Acute respiratory failure with hypoxia (HCC)   AKI (acute kidney injury) (HCC)   Community acquired pneumonia   1-Sepsis secondary to PNA, UTI Continue with Vancomycin, ceftriaxone and Azithromycin.  Follow urine culture, and blood culture.  WBC trend; 20---18.   Acute hypoxic Respiratory failure;  Secondary to PNA.  Patient was placed on BPAP in the ED,.  BIPAP PRN , on 10 L    Acute renal failure; related to sepsis, hypovolemia.  Continue with IV fluids.  Renal US; no hydronephrosis.  Cr peak to 3--it has decreased to 2.6  Hyponatremia; improved with IV fluids.   Elevated troponin;  Likely demand ischemia.   Mild  transaminases; trending down.    DVT prophylaxis: Heparin  Code Status: DNR Family Communication: care discussed with patient.  Disposition Plan: remain in the step down unit.    Consultants:  None   Procedures:  Renal US; negative for hydronephrosis.    Antimicrobials: Vancomycin 7-04       Ceftriaxone 7-04       Azithromycin 7-04   Subjective: He is feeling better, dyspnea improved.  He is on 10 L oxygen now.  Cough improved.    Objective: Vitals:   06/18/17 0200 06/18/17 0310 06/18/17 0400 06/18/17 0600  BP: (!) 100/30  (!) 105/28 (!) 126/37  Pulse: 63  (!) 59 84  Resp: (!) 30  (!) 28 (!) 22  Temp:  (!) 97.4 F (36.3 C)    TempSrc:  Oral    SpO2: 97%  95% 92%  Weight:   66.4 kg (146 lb 6.2 oz)   Height:        Intake/Output Summary (Last 24 hours) at 06/18/17 0659 Last data filed at 06/18/17 0600  Gross per 24 hour  Intake             3060 ml  Output              201 ml  Net             2859 ml   Filed Weights   06/17/17 1706 06/17/17 2000 06/18/17 0400  Weight: 67.1 kg (148 lb) 66.4 kg (146 lb 6.2 oz) 66.4 kg (146 lb 6.2 oz)    Examination:  General  exam: Appears calm and comfortable on 10 L  Respiratory system:. Respiratory effort normal. Bilateral course ronchus./  Cardiovascular system: S1 & S2 heard, RRR. No JVD, murmurs, rubs, gallops or clicks. No pedal edema. Gastrointestinal system: Abdomen is nondistended, soft and nontender. No organomegaly or masses felt. Normal bowel sounds heard. Central nervous system: Alert and oriented. No focal neurological deficits. Extremities: Symmetric 5 x 5 power. Skin: No rashes, lesions or ulcers Psychiatry: Judgement and insight appear normal. Mood & affect appropriate.     Data Reviewed: I have personally reviewed following labs and imaging studies  CBC:  Recent Labs Lab 06/17/17 1708 06/18/17 0502  WBC 20.9* 18.2*  NEUTROABS 18.2* 15.9*  HGB 15.7 12.4*  HCT 44.0 34.7*  MCV 91.5 89.4  PLT  299 215   Basic Metabolic Panel:  Recent Labs Lab 06/17/17 1708 06/18/17 0502  NA 132* 134*  K 3.6 3.1*  CL 99* 105  CO2 19* 18*  GLUCOSE 109* 104*  BUN 90* 83*  CREATININE 3.04* 2.67*  CALCIUM 8.7* 7.7*   GFR: Estimated Creatinine Clearance: 16.6 mL/min (A) (by C-G formula based on SCr of 2.67 mg/dL (H)). Liver Function Tests:  Recent Labs Lab 06/17/17 1708 06/18/17 0502  AST 29 17  ALT 18 13*  ALKPHOS 132* 80  BILITOT 1.3* 1.3*  PROT 7.4 5.7*  ALBUMIN 3.0* 2.2*   No results for input(s): LIPASE, AMYLASE in the last 168 hours. No results for input(s): AMMONIA in the last 168 hours. Coagulation Profile: No results for input(s): INR, PROTIME in the last 168 hours. Cardiac Enzymes:  Recent Labs Lab 06/17/17 2227 06/18/17 0502  TROPONINI 0.05* 0.03*   BNP (last 3 results) No results for input(s): PROBNP in the last 8760 hours. HbA1C: No results for input(s): HGBA1C in the last 72 hours. CBG: No results for input(s): GLUCAP in the last 168 hours. Lipid Profile: No results for input(s): CHOL, HDL, LDLCALC, TRIG, CHOLHDL, LDLDIRECT in the last 72 hours. Thyroid Function Tests: No results for input(s): TSH, T4TOTAL, FREET4, T3FREE, THYROIDAB in the last 72 hours. Anemia Panel: No results for input(s): VITAMINB12, FOLATE, FERRITIN, TIBC, IRON, RETICCTPCT in the last 72 hours. Sepsis Labs:  Recent Labs Lab 06/17/17 1715 06/17/17 2039  LATICACIDVEN 2.00* 1.7    Recent Results (from the past 240 hour(s))  MRSA PCR Screening     Status: None   Collection Time: 06/17/17 10:00 PM  Result Value Ref Range Status   MRSA by PCR NEGATIVE NEGATIVE Final    Comment:        The GeneXpert MRSA Assay (FDA approved for NASAL specimens only), is one component of a comprehensive MRSA colonization surveillance program. It is not intended to diagnose MRSA infection nor to guide or monitor treatment for MRSA infections.          Radiology Studies: [place]  Renal  Result Date: 06/18/2017 CLINICAL DATA:  Acute onset of renal insufficiency. Initial encounter. EXAM: RENAL / URINARY TRACT ULTRASOUND COMPLETE COMPARISON:  Renal ultrasound performed 06/05/2006 FINDINGS: Right Kidney: Length: 9.5 cm. Mildly increased parenchymal echogenicity is noted. No mass or hydronephrosis visualized. Left Kidney: Length: 10.4 cm. Mildly increased parenchymal echogenicity is noted. No mass or hydronephrosis visualized. Bladder: Appears normal for degree of bladder distention. Bilateral ureteral jets are visualized. IMPRESSION: 1. No evidence of hydronephrosis. 2. Mildly increased renal parenchymal echogenicity raises question for medical renal disease. Electronically Signed   By: Roanna RaiderJeffery  Chang M.D.   On: 06/18/2017 05:52   Dg Chest Port 1  View  Result Date: 06/17/2017 CLINICAL DATA:  Shortness of breath EXAM: PORTABLE CHEST 1 VIEW COMPARISON:  None. FINDINGS: 1656 hours. Low lung volumes. There is bibasilar airspace disease, left greater than right. The cardiopericardial silhouette is within normal limits for size. Bones are diffusely demineralized. Telemetry leads overlie the chest. IMPRESSION: Bibasilar airspace disease, left greater than right, suspicious for pneumonia. Follow-up imaging recommended to ensure resolution. Electronically Signed   By: Kennith Center M.D.   On: 06/17/2017 17:19        Scheduled Meds: . heparin  5,000 Units Subcutaneous Q8H   Continuous Infusions: . sodium chloride 75 mL/hr at 06/18/17 0600  . azithromycin Stopped (06/17/17 2325)  . cefTRIAXone (ROCEPHIN)  IV Stopped (06/17/17 2244)  . [START ON 06/19/2017] vancomycin       LOS: 1 day    Time spent: 35 minutes.     Alba Cory, MD [facility] Pager [phone]  If 7PM-7AM, please contact night-coverage [email or web address] Password [email or web address] 06/18/2017, 6:59 AM

## 2017-06-19 DIAGNOSIS — J9621 Acute and chronic respiratory failure with hypoxia: Secondary | ICD-10-CM

## 2017-06-19 LAB — CBC
HCT: 39 % (ref 39.0–52.0)
HEMOGLOBIN: 14.1 g/dL (ref 13.0–17.0)
MCH: 32.6 pg (ref 26.0–34.0)
MCHC: 36.2 g/dL — AB (ref 30.0–36.0)
MCV: 90.1 fL (ref 78.0–100.0)
PLATELETS: 262 10*3/uL (ref 150–400)
RBC: 4.33 MIL/uL (ref 4.22–5.81)
RDW: 13.1 % (ref 11.5–15.5)
WBC: 16.7 10*3/uL — ABNORMAL HIGH (ref 4.0–10.5)

## 2017-06-19 LAB — BASIC METABOLIC PANEL
Anion gap: 13 (ref 5–15)
Anion gap: 10 (ref 5–15)
BUN: 74 mg/dL — AB (ref 6–20)
BUN: 70 mg/dL — AB (ref 6–20)
CALCIUM: 8.2 mg/dL — AB (ref 8.9–10.3)
CALCIUM: 8.2 mg/dL — AB (ref 8.9–10.3)
CHLORIDE: 107 mmol/L (ref 101–111)
CO2: 15 mmol/L — ABNORMAL LOW (ref 22–32)
CO2: 19 mmol/L — ABNORMAL LOW (ref 22–32)
CREATININE: 2.35 mg/dL — AB (ref 0.61–1.24)
CREATININE: 2.16 mg/dL — AB (ref 0.61–1.24)
Chloride: 107 mmol/L (ref 101–111)
GFR calc non Af Amer: 25 mL/min — ABNORMAL LOW (ref >60)
GFR, EST AFRICAN AMERICAN: 26 mL/min — AB (ref >60)
GFR, EST AFRICAN AMERICAN: 29 mL/min — AB (ref >60)
GFR, EST NON AFRICAN AMERICAN: 22 mL/min — AB (ref >60)
Glucose, Bld: 102 mg/dL — ABNORMAL HIGH (ref 65–99)
Glucose, Bld: 121 mg/dL — ABNORMAL HIGH (ref 65–99)
Potassium: 3.4 mmol/L — ABNORMAL LOW (ref 3.5–5.1)
Potassium: 3.1 mmol/L — ABNORMAL LOW (ref 3.5–5.1)
SODIUM: 135 mmol/L (ref 135–145)
Sodium: 136 mmol/L (ref 135–145)

## 2017-06-19 LAB — EXPECTORATED SPUTUM ASSESSMENT W REFEX TO RESP CULTURE

## 2017-06-19 LAB — MAGNESIUM: MAGNESIUM: 2 mg/dL (ref 1.7–2.4)

## 2017-06-19 MED ORDER — SODIUM BICARBONATE 650 MG PO TABS
1300.0000 mg | ORAL_TABLET | Freq: Three times a day (TID) | ORAL | Status: DC
  Administered 2017-06-19 – 2017-06-22 (×10): 1300 mg via ORAL
  Filled 2017-06-19 (×10): qty 2

## 2017-06-19 MED ORDER — POTASSIUM CHLORIDE CRYS ER 20 MEQ PO TBCR
20.0000 meq | EXTENDED_RELEASE_TABLET | Freq: Once | ORAL | Status: AC
  Administered 2017-06-19: 20 meq via ORAL
  Filled 2017-06-19: qty 1

## 2017-06-19 MED ORDER — POLYETHYLENE GLYCOL 3350 17 G PO PACK
17.0000 g | PACK | Freq: Two times a day (BID) | ORAL | Status: DC
  Administered 2017-06-19 – 2017-06-21 (×4): 17 g via ORAL
  Filled 2017-06-19 (×5): qty 1

## 2017-06-19 MED ORDER — AMLODIPINE BESYLATE 5 MG PO TABS
5.0000 mg | ORAL_TABLET | Freq: Every day | ORAL | Status: DC
  Administered 2017-06-19 – 2017-06-22 (×4): 5 mg via ORAL
  Filled 2017-06-19 (×4): qty 1

## 2017-06-19 NOTE — Progress Notes (Signed)
  Speech Language Pathology Treatment: Dysphagia  Patient Details Name: Clayton Hoffman MRN: [id number] DOB: 11/11/1922 Today's Date: 06/19/2017 Time: [id number] SLP Time Calculation (min) (ACUTE ONLY): 28 min  Assessment / Plan / Recommendation Clinical Impression  Pt seen to assess tolerance of po diet - pt admits to issues with swallowing on occasion.  He previously reported problems with swallowing sensing food lodging in esophagus - he reports liquid consumption helpful to clear.  Denies issues with solids lately.    Pt observed consuming water and pills with applesauce.  No indication of airway compromise and pt with protective exhalation post=swallow.  With consumption of sequential boluses of thin - delayed throat clearing and wet gurgly voice x 1 cleared with cued throat clearing.  Pt educated to precautions to mitigate dysphagia symptoms using teach back and providing in writing.    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    Will follow up x1 to assure tolerance especially given pna and report of occasional problems swallowing.   HPI HPI: Clayton BussingLucius W Coldronis a 83 y.o.malewith medical history significant for hypertension presented to the emergency department with fevers, shortness of breath, productive cough, and lethargy. Chest x-ray is notable for bibasilar airspace disease, left greater than right, suspicious for pneumonia. Per MD Urinalysis is highly suggestive of infection. No prior ST notes found.      SLP Plan  Continue with current plan of care       Recommendations  Diet recommendations: Regular;Thin liquid Liquids provided via: Cup;No straw Medication Administration: Whole meds with puree Compensations: Slow rate;Small sips/bites (rest breaks if needed) Postural Changes and/or Swallow Maneuvers: Seated upright 90 degrees;Upright 30-60 min after meal                 Oral Care Recommendations: Oral care BID Follow up Recommendations:  (TBD) SLP Visit Diagnosis: Dysphagia, unspecified (R13.10) Plan: Continue with current plan of care       GO               Clayton Burnetamara Kadien Lineman, MS [facility] SLP [phone]

## 2017-06-19 NOTE — Progress Notes (Signed)
  Patient Details Name: Clayton Hoffman MRN: [id number] DOB: 08/20/1922 Today's Date: 06/19/2017  SATURATION QUALIFICATIONS: (This note is used to comply with regulatory documentation for home oxygen)  Patient Saturations on Room Air at Rest = 92%  Patient Saturations on Room Air while Ambulating = 82%  Patient Saturations on 2 Liters of oxygen while Ambulating = 90%  Please briefly explain why patient needs home oxygen: to improve oxygen saturation above 88% during physical activity such as ambulation.  Zenovia JarredKati Reade Trefz, PT, DPT 06/19/2017 Pager: [phone]

## 2017-06-19 NOTE — Progress Notes (Signed)
Physical Therapy Evaluation Patient Details Name: Clayton Hoffman MRN: [id number] DOB: 12/12/1922 Today's Date: 06/19/2017   History of Present Illness  Pt is a 83 y.o. male with PMH of hypertension who presented to the ED with fevers, shortness of breath, productive cough, and lethargy. Pt. admitted to the hospital for sepsis.  Clinical Impression  Pt admitted with sepsis. Pt currently with functional limitations due to the deficits listed below. Pt will benefit from skilled PT to increase their independence and safety with mobility to allow discharge. Pt ambulated on room air and SPO2 decreased to 82% during activity. O2 was given at 2L/min which increased SPO2 to 90% during activity. Pt also utilized a RW and stated he had several walkers in his possession at home. Pt is familiar with therapy and declines SNF at this time and reports he will discharge home.     Follow Up Recommendations Home health PT;Supervision for mobility/OOB    Equipment Recommendations  None recommended by PT    Recommendations for Other Services       Precautions / Restrictions Precautions Precautions: Fall Precaution Comments: monitor sats      Mobility  Bed Mobility                  Transfers Overall transfer level: Needs assistance Equipment used: Rolling walker (2 wheeled) Transfers: Sit to/from Stand Sit to Stand: Min assist         General transfer comment: Pt was unsteady during intitiation of transfer and required verbal cues for hand placement  Ambulation/Gait Ambulation/Gait assistance: Min assist Ambulation Distance (Feet): 80 Feet Assistive device: Rolling walker (2 wheeled) Gait Pattern/deviations: Step-through pattern;Decreased stride length     General Gait Details: Pt started walking without oxygen but SPO2 decreased to 82%, pt recieved O2 at 2L/min and SPO2 increased to 90% while ambulating   Stairs            Wheelchair Mobility    Modified Rankin (Stroke  Patients Only)       Balance Overall balance assessment: Needs assistance           Standing balance-Leahy Scale: Poor Standing balance comment: Pt required UE support through the use of a RW                             Pertinent Vitals/Pain Pain Assessment: No/denies pain    Home Living Family/patient expects to be discharged to:: Private residence Living Arrangements: Alone   Type of Home: House         Home Equipment: Environmental consultantWalker - 2 wheels      Prior Function Level of Independence: Independent         Comments: pt reports he was mowing his lawn prior to admission     Hand Dominance        Extremity/Trunk Assessment   Upper Extremity Assessment Upper Extremity Assessment: Overall WFL for tasks assessed    Lower Extremity Assessment Lower Extremity Assessment: Generalized weakness       Communication   Communication: HOH  Cognition Arousal/Alertness: Awake/alert Behavior During Therapy: WFL for tasks assessed/performed Overall Cognitive Status: Within Functional Limits for tasks assessed                                        General Comments      Exercises  Assessment/Plan    PT Assessment Patient needs continued PT services  PT Problem List Decreased strength;Decreased mobility;Decreased safety awareness;Decreased activity tolerance;Decreased balance;Decreased knowledge of use of DME;Decreased coordination       PT Treatment Interventions Gait training;Therapeutic exercise;Functional mobility training;Balance training;Patient/family education;DME instruction;Therapeutic activities    PT Goals (Current goals can be found in the Care Plan section)  Acute Rehab PT Goals Patient Stated Goal: Pt would like to return home PT Goal Formulation: With patient Time For Goal Achievement: 07/03/17 Potential to Achieve Goals: Good    Frequency Min 3X/week   Barriers to discharge        Co-evaluation                AM-PAC PT "6 Clicks" Daily Activity  Outcome Measure Difficulty turning over in bed (including adjusting bedclothes, sheets and blankets)?: None Difficulty moving from lying on back to sitting on the side of the bed? : None Difficulty sitting down on and standing up from a chair with arms (e.g., wheelchair, bedside commode, etc,.)?: Total Help needed moving to and from a bed to chair (including a wheelchair)?: A Little Help needed walking in hospital room?: A Little Help needed climbing 3-5 steps with a railing? : A Lot 6 Click Score: 17    End of Session Equipment Utilized During Treatment: Gait belt;Oxygen Activity Tolerance: Patient tolerated treatment well Patient left: in chair;with call bell/phone within reach Nurse Communication: Other (comment) (Nurse was informed of swelling of bilateral LEs as patient asked for compression stockings) PT Visit Diagnosis: Difficulty in walking, not elsewhere classified (R26.2)    Time: [id number] PT Time Calculation (min) (ACUTE ONLY): 17 min   Charges:   PT Evaluation $PT Eval Low Complexity: 1 Procedure     PT G Codes:        Kathrene Bongo, SPT  Kathrene Bongo 06/19/2017, 3:40 PM

## 2017-06-19 NOTE — Progress Notes (Signed)
PROGRESS NOTE    Clayton Hoffman  ZOX:[id number] DOB: [birth year] DOA: 06/17/2017 PCP: Patient, [name]   Brief Narrative: Clayton Hoffman is a 83 y.o. male with medical history significant for hypertension, now presenting to the emergency department with fevers, shortness of breath, productive cough, and lethargy.   Upon arrival to the ED, patient is found to be febrile to 38.9 C, saturating 81% on room air, tachypneic, slightly tachycardic, and was stable blood pressure. EKG features a sinus rhythm with PACs and nonspecific repolarization abnormality. Chest x-ray is notable for bibasilar airspace disease, left greater than right, suspicious for pneumonia. Chemistry panel reveals a sodium of 132, bicarbonate of 19, BUN of 90, and serum creatinine of 3.04 with no prior chemistry panel available for comparison. CBC features a leukocytosis to 20,900, lactic acid is elevated to 2.00, troponin is mildly elevated to 0.10, BNP is also elevated to 745. Urinalysis is highly suggestive of infection   Assessment & Plan:   Principal Problem:   Sepsis due to pneumonia [facility]) Active Problems:   Acute on chronic respiratory failure with hypoxia (HCC)   Urinary tract infection with hematuria   Hyponatremia   Elevated troponin   Acute renal failure (ARF) (HCC)   Acute respiratory failure with hypoxia (HCC)   AKI (acute kidney injury) (HCC)   Community acquired pneumonia   1-Sepsis secondary to PNA, UTI Continue with ceftriaxone and Azithromycin.  Follow urine culture e coli , and blood culture.no growth to date.   WBC trend; 20---18--16 Stop vancomycin.   Acute hypoxic Respiratory failure;  Secondary to PNA.  Patient was placed on BPAP in the ED,.  On 3 L oxygen    Acute renal failure; related to sepsis, hypovolemia.  Continue with IV fluids.  Renal US; no hydronephrosis.  Cr peak to 3--it has decreased to 2.6--2.3 Metabolic acidosis; start oral bicarb.   Hyponatremia; improved with IV  fluids.   Hypokalemia; replete orally.   Elevated troponin;  Likely demand ischemia.   Mild transaminases; trending down.   Constipation; start miralax.   DVT prophylaxis: Heparin  Code Status: DNR Family Communication: care discussed with son  Disposition Plan: remain in the step down unit.    Consultants:  None   Procedures:  Renal US; negative for hydronephrosis.    Antimicrobials: Vancomycin 7-04       Ceftriaxone 7-04       Azithromycin 7-04   Subjective: He is breathing better, no Bm in few days.     Objective: Vitals:   06/19/17 0400 06/19/17 0425 06/19/17 0600 06/19/17 0800  BP: 127/60  (!) 157/62 (!) 165/70  Pulse: 75  64 73  Resp: (!) 26  (!) 25 (!) 23  Temp:  97.6 F (36.4 C)  (!) 97.4 F (36.3 C)  TempSrc:  Oral  Oral  SpO2: 100%  95% 95%  Weight:      Height:        Intake/Output Summary (Last 24 hours) at 06/19/17 1034 Last data filed at 06/19/17 0800  Gross per 24 hour  Intake             1875 ml  Output              400 ml  Net             1475 ml   Filed Weights   06/17/17 1706 06/17/17 2000 06/18/17 0400  Weight: 67.1 kg (148 lb) 66.4 kg (146 lb 6.2 oz) 66.4 kg (  146 lb 6.2 oz)    Examination:  General exam: NAD. 3 L oxygen  Respiratory system:. Bilateral ronchus.  Cardiovascular system: S 1, S 2 RRR, no edema Gastrointestinal system: BS present, soft, nt Central nervous system: non focal.  Extremities: Symmetric 5 x 5 power. Skin: No rashes, lesions or ulcers     Data Reviewed: I have personally reviewed following labs and imaging studies  CBC:  Recent Labs Lab 06/17/17 1708 06/18/17 0502 06/19/17 0318  WBC 20.9* 18.2* 16.7*  NEUTROABS 18.2* 15.9*  --   HGB 15.7 12.4* 14.1  HCT 44.0 34.7* 39.0  MCV 91.5 89.4 90.1  PLT 299 215 262   Basic Metabolic Panel:  Recent Labs Lab 06/17/17 1708 06/18/17 0502 06/19/17 0318  NA 132* 134* 135  K 3.6 3.1* 3.4*  CL 99* 105 107  CO2 19* 18* 15*  GLUCOSE 109*  104* 102*  BUN 90* 83* 74*  CREATININE 3.04* 2.67* 2.35*  CALCIUM 8.7* 7.7* 8.2*   GFR: Estimated Creatinine Clearance: 18.8 mL/min (A) (by C-G formula based on SCr of 2.35 mg/dL (H)). Liver Function Tests:  Recent Labs Lab 06/17/17 1708 06/18/17 0502  AST 29 17  ALT 18 13*  ALKPHOS 132* 80  BILITOT 1.3* 1.3*  PROT 7.4 5.7*  ALBUMIN 3.0* 2.2*   No results for input(s): LIPASE, AMYLASE in the last 168 hours. No results for input(s): AMMONIA in the last 168 hours. Coagulation Profile: No results for input(s): INR, PROTIME in the last 168 hours. Cardiac Enzymes:  Recent Labs Lab 06/17/17 2227 06/18/17 0502  TROPONINI 0.05* 0.03*   BNP (last 3 results) No results for input(s): PROBNP in the last 8760 hours. HbA1C: No results for input(s): HGBA1C in the last 72 hours. CBG: No results for input(s): GLUCAP in the last 168 hours. Lipid Profile: No results for input(s): CHOL, HDL, LDLCALC, TRIG, CHOLHDL, LDLDIRECT in the last 72 hours. Thyroid Function Tests: No results for input(s): TSH, T4TOTAL, FREET4, T3FREE, THYROIDAB in the last 72 hours. Anemia Panel: No results for input(s): VITAMINB12, FOLATE, FERRITIN, TIBC, IRON, RETICCTPCT in the last 72 hours. Sepsis Labs:  Recent Labs Lab 06/17/17 1715 06/17/17 2039  LATICACIDVEN 2.00* 1.7    Recent Results (from the past 240 hour(s))  Urine culture     Status: Abnormal (Preliminary result)   Collection Time: 06/17/17  5:00 PM  Result Value Ref Range Status   Specimen Description URINE, CLEAN CATCH  Final   Special Requests NONE  Final   Culture (A)  Final    20,000 COLONIES/mL ESCHERICHIA COLI SUSCEPTIBILITIES TO FOLLOW Performed at [facility], [address] [place], [place] [address]    Report Status PENDING  Incomplete  Blood Culture (routine x 2)     Status: None (Preliminary result)   Collection Time: 06/17/17  5:08 PM  Result Value Ref Range Status   Specimen Description BLOOD LEFT ANTECUBITAL   Final   Special Requests   Final    BOTTLES DRAWN AEROBIC AND ANAEROBIC Blood Culture adequate volume   Culture   Final    NO GROWTH < 24 HOURS Performed at [facility], [address] [place], [place] [address]    Report Status PENDING  Incomplete  Blood Culture (routine x 2)     Status: None (Preliminary result)   Collection Time: 06/17/17  5:20 PM  Result Value Ref Range Status   Specimen Description BLOOD  Final   Special Requests   Final  BOTTLES DRAWN AEROBIC AND ANAEROBIC Blood Culture adequate volume   Culture   Final    NO GROWTH < 24 HOURS Performed at [facility], [address], [place], [place] [address]    Report Status PENDING  Incomplete  Culture, sputum-assessment     Status: None   Collection Time: 06/17/17  7:51 PM  Result Value Ref Range Status   Specimen Description SPUTUM  Final   Special Requests NONE  Final   Sputum evaluation   Final    Sputum specimen not acceptable for testing.  Please recollect.   CALLED LISA RN [phone] 06/19/17 A NAVARRO    Report Status 06/19/2017 FINAL  Final  MRSA PCR Screening     Status: None   Collection Time: 06/17/17 10:00 PM  Result Value Ref Range Status   MRSA by PCR NEGATIVE NEGATIVE Final    Comment:        The GeneXpert MRSA Assay (FDA approved for NASAL specimens only), is one component of a comprehensive MRSA colonization surveillance program. It is not intended to diagnose MRSA infection nor to guide or monitor treatment for MRSA infections.          Radiology Studies: US Renal  Result Date: 06/18/2017 CLINICAL DATA:  Acute onset of renal insufficiency. Initial encounter. EXAM: RENAL / URINARY TRACT ULTRASOUND COMPLETE COMPARISON:  Renal ultrasound performed 06/05/2006 FINDINGS: Right Kidney: Length: 9.5 cm. Mildly increased parenchymal echogenicity is noted. No mass or hydronephrosis visualized. Left Kidney: Length: 10.4 cm. Mildly increased parenchymal echogenicity is noted. No mass or  hydronephrosis visualized. Bladder: Appears normal for degree of bladder distention. Bilateral ureteral jets are visualized. IMPRESSION: 1. No evidence of hydronephrosis. 2. Mildly increased renal parenchymal echogenicity raises question for medical renal disease. Electronically Signed   By: Roanna Raider M.D.   On: 06/18/2017 05:52   Dg Chest Port 1 View  Result Date: 06/17/2017 CLINICAL DATA:  Shortness of breath EXAM: PORTABLE CHEST 1 VIEW COMPARISON:  None. FINDINGS: 1656 hours. Low lung volumes. There is bibasilar airspace disease, left greater than right. The cardiopericardial silhouette is within normal limits for size. Bones are diffusely demineralized. Telemetry leads overlie the chest. IMPRESSION: Bibasilar airspace disease, left greater than right, suspicious for pneumonia. Follow-up imaging recommended to ensure resolution. Electronically Signed   By: Kennith Center M.D.   On: 06/17/2017 17:19        Scheduled Meds: . aspirin EC  81 mg Oral Daily  . atenolol  50 mg Oral Daily  . heparin  5,000 Units Subcutaneous Q8H  . mouth rinse  15 mL Mouth Rinse BID  . sodium bicarbonate  1,300 mg Oral Q8H   Continuous Infusions: . sodium chloride 75 mL/hr at 06/18/17 2320  . azithromycin Stopped (06/18/17 2212)  . cefTRIAXone (ROCEPHIN)  IV Stopped (06/18/17 2142)     LOS: 2 days    Time spent: 35 minutes.     Alba Cory, MD [facility] Pager [phone]  If 7PM-7AM, please contact night-coverage [email or web address] Password [email or web address] 06/19/2017, 10:34 AM

## 2017-06-20 LAB — CBC
HCT: 36.9 % — ABNORMAL LOW (ref 39.0–52.0)
Hemoglobin: 13 g/dL (ref 13.0–17.0)
MCH: 31.9 pg (ref 26.0–34.0)
MCHC: 35.2 g/dL (ref 30.0–36.0)
MCV: 90.4 fL (ref 78.0–100.0)
PLATELETS: 283 10*3/uL (ref 150–400)
RBC: 4.08 MIL/uL — ABNORMAL LOW (ref 4.22–5.81)
RDW: 13.2 % (ref 11.5–15.5)
WBC: 12.6 10*3/uL — ABNORMAL HIGH (ref 4.0–10.5)

## 2017-06-20 LAB — BASIC METABOLIC PANEL
Anion gap: 9 (ref 5–15)
BUN: 64 mg/dL — AB (ref 6–20)
CHLORIDE: 106 mmol/L (ref 101–111)
CO2: 21 mmol/L — ABNORMAL LOW (ref 22–32)
CREATININE: 2.04 mg/dL — AB (ref 0.61–1.24)
Calcium: 8.2 mg/dL — ABNORMAL LOW (ref 8.9–10.3)
GFR calc Af Amer: 31 mL/min — ABNORMAL LOW (ref >60)
GFR, EST NON AFRICAN AMERICAN: 27 mL/min — AB (ref >60)
GLUCOSE: 98 mg/dL (ref 65–99)
Potassium: 3.3 mmol/L — ABNORMAL LOW (ref 3.5–5.1)
SODIUM: 136 mmol/L (ref 135–145)

## 2017-06-20 LAB — URINE CULTURE

## 2017-06-20 MED ORDER — AZITHROMYCIN 250 MG PO TABS
500.0000 mg | ORAL_TABLET | Freq: Every day | ORAL | Status: DC
  Administered 2017-06-21 (×2): 500 mg via ORAL
  Filled 2017-06-20 (×2): qty 2

## 2017-06-20 MED ORDER — GUAIFENESIN-DM 100-10 MG/5ML PO SYRP: 5.0000 mL | ORAL_SOLUTION | ORAL | Status: DC | PRN

## 2017-06-20 MED ORDER — POTASSIUM CHLORIDE CRYS ER 20 MEQ PO TBCR
20.0000 meq | EXTENDED_RELEASE_TABLET | Freq: Once | ORAL | Status: AC
  Administered 2017-06-20: 20 meq via ORAL
  Filled 2017-06-20: qty 1

## 2017-06-20 NOTE — Progress Notes (Signed)
OT Cancellation Note  Patient Details Name: Clayton Hoffman MRN: [id number] DOB: 10/06/1922   Cancelled Treatment:    Reason Eval/Treat Not Completed: Other (comment) -- Note d/c plan is SNF. Will defer OT needs to SNF.  Ellen Goris A Raidyn Breiner 06/20/2017, 2:48 PM

## 2017-06-20 NOTE — Progress Notes (Signed)
Patient with 7 beat run of vtach. VSS. Patient resting comfortably in chair. MD on call paged. New orders placed. Will continue to monitor closely

## 2017-06-20 NOTE — Clinical Social Work Note (Signed)
CSW order received to assist patient with SNF placement.  Patient has been seen by PT and HHPT is recommended.  Note also indicates that patient refuses SNF.  Patient noted to live alone; ambulated 80 feet with RW per PT report. CSW services will sign off for now but will be available should d/c plan change.  Lorri Frederickonna T. Jaci LazierCrowder, LCSW [phone]  (weekend d/c)

## 2017-06-20 NOTE — Progress Notes (Signed)
Physical Therapy Treatment Patient Details Name: Clayton Hoffman MRN: [id number] DOB: 09/04/1922 Today's Date: 06/20/2017    History of Present Illness Pt is a 83 y.o. male with PMH of hypertension who presented to the ED with fevers, shortness of breath, productive cough, and lethargy. Pt. admitted to the hospital for sepsis.    PT Comments    Progressing with mobility. Discussed d/c plan-pt is now agreeable to ST SNF stay to regain PLOF and independence. Recommend SNF. Will continue to follow.    Follow Up Recommendations  SNF     Equipment Recommendations  None recommended by PT    Recommendations for Other Services       Precautions / Restrictions Precautions Precautions: Fall Precaution Comments: monitor sats Restrictions Weight Bearing Restrictions: No    Mobility  Bed Mobility               General bed mobility comments: oob in recliner  Transfers Overall transfer level: Needs assistance Equipment used: Rolling walker (2 wheeled) Transfers: Sit to/from Stand Sit to Stand: Min assist         General transfer comment: Assist to rise, steady, control descent. VCs safety, hand placement  Ambulation/Gait Ambulation/Gait assistance: Min assist Ambulation Distance (Feet): 75 Feet (x2) Assistive device: 4-wheeled walker Gait Pattern/deviations: Step-through pattern;Decreased stride length     General Gait Details: Assist to stabilize intermittently. O2 sat 87% on 2L O2. One seated rest break taken between walks.    Stairs            Wheelchair Mobility    Modified Rankin (Stroke Patients Only)       Balance Overall balance assessment: Needs assistance         Standing balance support: Bilateral upper extremity supported Standing balance-Leahy Scale: Poor                              Cognition Arousal/Alertness: Awake/alert Behavior During Therapy: WFL for tasks assessed/performed Overall Cognitive Status: Within  Functional Limits for tasks assessed                                        Exercises      General Comments        Pertinent Vitals/Pain Pain Assessment: No/denies pain    Home Living                      Prior Function            PT Goals (current goals can now be found in the care plan section) Progress towards PT goals: Progressing toward goals    Frequency    Min 3X/week      PT Plan Discharge plan needs to be updated    Co-evaluation              AM-PAC PT "6 Clicks" Daily Activity  Outcome Measure  Difficulty turning over in bed (including adjusting bedclothes, sheets and blankets)?: None Difficulty moving from lying on back to sitting on the side of the bed? : None Difficulty sitting down on and standing up from a chair with arms (e.g., wheelchair, bedside commode, etc,.)?: Total Help needed moving to and from a bed to chair (including a wheelchair)?: A Little Help needed walking in hospital room?: A Little Help needed climbing 3-5 steps with a railing? :  A Little 6 Click Score: 18    End of Session Equipment Utilized During Treatment: Gait belt;Oxygen Activity Tolerance: Patient tolerated treatment well Patient left: in chair;with call bell/phone within reach   PT Visit Diagnosis: Muscle weakness (generalized) (M62.81);Difficulty in walking, not elsewhere classified (R26.2)     Time: 1610-9604 PT Time Calculation (min) (ACUTE ONLY): 24 min  Charges:  $Gait Training: 8-22 mins $Therapeutic Activity: 8-22 mins                    G Codes:          Rebeca Alert, MPT Pager: [phone]

## 2017-06-20 NOTE — Progress Notes (Signed)
PHARMACIST - PHYSICIAN COMMUNICATION DR:   Clayton Hoffman CONCERNING: Antibiotic IV to Oral Route Change Policy  RECOMMENDATION: This patient is receiving azithromycin by the intravenous route.  Based on criteria approved by the Pharmacy and Therapeutics Committee, the antibiotic(s) is/are being converted to the equivalent oral dose form(s).   DESCRIPTION: These criteria include:  Patient being treated for a respiratory tract infection, urinary tract infection, cellulitis or clostridium difficile associated diarrhea if on metronidazole  The patient is not neutropenic and does not exhibit a GI malabsorption state  The patient is eating (either orally or via tube) and/or has been taking other orally administered medications for a least 24 hours  The patient is improving clinically and has a Tmax < 100.5  If you have questions about this conversion, please contact the Pharmacy Department  []   [phone]( [phone] )  [name] []   [phone]( [phone] )  [name] []   [phone]( [phone] )  [name] []   [phone]( [phone] )  [name] [x]   [phone]( [phone] )  [name]   Dorna LeitzAnh Emannuel Vise, PharmD, BCPS 06/20/2017 9:59 AM

## 2017-06-20 NOTE — Progress Notes (Signed)
PROGRESS NOTE    Clayton Hoffman  ZOX:[id number] DOB: 06-Feb-1922 DOA: 06/17/2017 PCP: Patient, [name]   Brief Narrative: Clayton Hoffman is a 83 y.o. male with medical history significant for hypertension, now presenting to the emergency department with fevers, shortness of breath, productive cough, and lethargy.   Upon arrival to the ED, patient is found to be febrile to 38.9 C, saturating 81% on room air, tachypneic, slightly tachycardic, and was stable blood pressure. EKG features a sinus rhythm with PACs and nonspecific repolarization abnormality. Chest x-ray is notable for bibasilar airspace disease, left greater than right, suspicious for pneumonia. Chemistry panel reveals a sodium of 132, bicarbonate of 19, BUN of 90, and serum creatinine of 3.04 with no prior chemistry panel available for comparison. CBC features a leukocytosis to 20,900, lactic acid is elevated to 2.00, troponin is mildly elevated to 0.10, BNP is also elevated to 745. Urinalysis is highly suggestive of infection   Assessment & Plan:   Principal Problem:   Sepsis due to pneumonia [facility]) Active Problems:   Acute on chronic respiratory failure with hypoxia (HCC)   Urinary tract infection with hematuria   Hyponatremia   Elevated troponin   Acute renal failure (ARF) (HCC)   Acute respiratory failure with hypoxia (HCC)   AKI (acute kidney injury) (HCC)   Community acquired pneumonia   1-Sepsis secondary to PNA, UTI Continue with ceftriaxone and Azithromycin.  Follow urine culture e coli , and blood culture.no growth to date.   WBC trend; 20---18--16--12 Stop vancomycin.   Acute hypoxic Respiratory failure;  Secondary to PNA.  Patient was placed on BPAP in the ED,.  On 2 L oxygen    Acute renal failure; related to sepsis, hypovolemia.  Continue with IV fluids.  Renal US; no hydronephrosis.  Cr peak to 3--it has decreased to 2.6--2.3 Metabolic acidosis; started  oral bicarb. Improved.   Hyponatremia;  resolved with fluid.   Hypokalemia; replete orally.   Elevated troponin;  Likely demand ischemia.   Mild transaminases; trending down.   Constipation; started miralax. Had BM   DVT prophylaxis: Heparin  Code Status: DNR Family Communication: care discussed with son  Disposition Plan: plan for SNF, awaiting improvement of renal function    Consultants:  None   Procedures:  Renal US; negative for hydronephrosis.    Antimicrobials: Vancomycin 7-04       Ceftriaxone 7-04       Azithromycin 7-04   Subjective: He is feeling better, breathing better.     Objective: Vitals:   06/19/17 1351 06/19/17 1923 06/20/17 0543 06/20/17 1339  BP: (!) 151/50 (!) 141/67 (!) 142/50 (!) 112/53  Pulse: 75  67 64  Resp: 20 (!) 22 20 18   Temp: 97.9 F (36.6 C) (!) 97.3 F (36.3 C) 99.3 F (37.4 C) 97.9 F (36.6 C)  TempSrc: Oral Oral Oral Oral  SpO2: 94% 99% 97% 97%  Weight:   67.4 kg (148 lb 8 oz)   Height:        Intake/Output Summary (Last 24 hours) at 06/20/17 1413 Last data filed at 06/20/17 1004  Gross per 24 hour  Intake          1413.33 ml  Output              700 ml  Net           713.33 ml   Filed Weights   06/17/17 2000 06/18/17 0400 06/20/17 0543  Weight: 66.4 kg (146 lb  6.2 oz) 66.4 kg (146 lb 6.2 oz) 67.4 kg (148 lb 8 oz)    Examination:  General exam: NAD Respiratory system:. Bilateral ronchus.  Cardiovascular system: S 1, S 2 RRR Gastrointestinal system: BS present, soft, nt Central nervous system: non focal.  Extremities: Symmetric power.  Skin: No rashes, lesions or ulcers     Data Reviewed: I have personally reviewed following labs and imaging studies  CBC:  Recent Labs Lab 06/17/17 1708 06/18/17 0502 06/19/17 0318 06/20/17 0521  WBC 20.9* 18.2* 16.7* 12.6*  NEUTROABS 18.2* 15.9*  --   --   HGB 15.7 12.4* 14.1 13.0  HCT 44.0 34.7* 39.0 36.9*  MCV 91.5 89.4 90.1 90.4  PLT 299 215 262 283   Basic Metabolic Panel:  Recent  Labs Lab 06/17/17 1708 06/18/17 0502 06/19/17 0318 06/19/17 2008 06/20/17 0521  NA 132* 134* 135 136 136  K 3.6 3.1* 3.4* 3.1* 3.3*  CL 99* 105 107 107 106  CO2 19* 18* 15* 19* 21*  GLUCOSE 109* 104* 102* 121* 98  BUN 90* 83* 74* 70* 64*  CREATININE 3.04* 2.67* 2.35* 2.16* 2.04*  CALCIUM 8.7* 7.7* 8.2* 8.2* 8.2*  MG  --   --   --  2.0  --    GFR: Estimated Creatinine Clearance: 22 mL/min (A) (by C-G formula based on SCr of 2.04 mg/dL (H)). Liver Function Tests:  Recent Labs Lab 06/17/17 1708 06/18/17 0502  AST 29 17  ALT 18 13*  ALKPHOS 132* 80  BILITOT 1.3* 1.3*  PROT 7.4 5.7*  ALBUMIN 3.0* 2.2*   No results for input(s): LIPASE, AMYLASE in the last 168 hours. No results for input(s): AMMONIA in the last 168 hours. Coagulation Profile: No results for input(s): INR, PROTIME in the last 168 hours. Cardiac Enzymes:  Recent Labs Lab 06/17/17 2227 06/18/17 0502  TROPONINI 0.05* 0.03*   BNP (last 3 results) No results for input(s): PROBNP in the last 8760 hours. HbA1C: No results for input(s): HGBA1C in the last 72 hours. CBG: No results for input(s): GLUCAP in the last 168 hours. Lipid Profile: No results for input(s): CHOL, HDL, LDLCALC, TRIG, CHOLHDL, LDLDIRECT in the last 72 hours. Thyroid Function Tests: No results for input(s): TSH, T4TOTAL, FREET4, T3FREE, THYROIDAB in the last 72 hours. Anemia Panel: No results for input(s): VITAMINB12, FOLATE, FERRITIN, TIBC, IRON, RETICCTPCT in the last 72 hours. Sepsis Labs:  Recent Labs Lab 06/17/17 1715 06/17/17 2039  LATICACIDVEN 2.00* 1.7    Recent Results (from the past 240 hour(s))  Urine culture     Status: Abnormal   Collection Time: 06/17/17  5:00 PM  Result Value Ref Range Status   Specimen Description URINE, CLEAN CATCH  Final   Special Requests NONE  Final   Culture 20,000 COLONIES/mL ESCHERICHIA COLI (A)  Final   Report Status 06/20/2017 FINAL  Final   Organism ID, Bacteria ESCHERICHIA COLI  (A)  Final      Susceptibility   Escherichia coli - MIC*    AMPICILLIN 4 SENSITIVE Sensitive     CEFAZOLIN <=4 SENSITIVE Sensitive     CEFTRIAXONE <=1 SENSITIVE Sensitive     CIPROFLOXACIN <=0.25 SENSITIVE Sensitive     GENTAMICIN <=1 SENSITIVE Sensitive     IMIPENEM <=0.25 SENSITIVE Sensitive     NITROFURANTOIN <=16 SENSITIVE Sensitive     TRIMETH/SULFA <=20 SENSITIVE Sensitive     AMPICILLIN/SULBACTAM <=2 SENSITIVE Sensitive     PIP/TAZO <=4 SENSITIVE Sensitive     Extended ESBL NEGATIVE Sensitive     *  20,000 COLONIES/mL ESCHERICHIA COLI  Blood Culture (routine x 2)     Status: None (Preliminary result)   Collection Time: 06/17/17  5:08 PM  Result Value Ref Range Status   Specimen Description BLOOD LEFT ANTECUBITAL  Final   Special Requests   Final    BOTTLES DRAWN AEROBIC AND ANAEROBIC Blood Culture adequate volume   Culture   Final    NO GROWTH 3 DAYS Performed at [facility], [address] [place], [place] [address]    Report Status PENDING  Incomplete  Blood Culture (routine x 2)     Status: None (Preliminary result)   Collection Time: 06/17/17  5:20 PM  Result Value Ref Range Status   Specimen Description BLOOD  Final   Special Requests   Final    BOTTLES DRAWN AEROBIC AND ANAEROBIC Blood Culture adequate volume   Culture   Final    NO GROWTH 3 DAYS Performed at [facility], [address], [place], [place] [address]    Report Status PENDING  Incomplete  Culture, sputum-assessment     Status: None   Collection Time: 06/17/17  7:51 PM  Result Value Ref Range Status   Specimen Description SPUTUM  Final   Special Requests NONE  Final   Sputum evaluation   Final    Sputum specimen not acceptable for testing.  Please recollect.   CALLED LISA RN [phone] 06/19/17 A NAVARRO    Report Status 06/19/2017 FINAL  Final  MRSA PCR Screening     Status: None   Collection Time: 06/17/17 10:00 PM  Result Value Ref Range Status   MRSA by PCR NEGATIVE NEGATIVE Final     Comment:        The GeneXpert MRSA Assay (FDA approved for NASAL specimens only), is one component of a comprehensive MRSA colonization surveillance program. It is not intended to diagnose MRSA infection nor to guide or monitor treatment for MRSA infections.          Radiology Studies: No results found.      Scheduled Meds: . amLODipine  5 mg Oral Daily  . aspirin EC  81 mg Oral Daily  . atenolol  50 mg Oral Daily  . azithromycin  500 mg Oral QHS  . heparin  5,000 Units Subcutaneous Q8H  . mouth rinse  15 mL Mouth Rinse BID  . polyethylene glycol  17 g Oral BID  . sodium bicarbonate  1,300 mg Oral Q8H   Continuous Infusions: . sodium chloride 45 mL/hr at 06/20/17 1351  . cefTRIAXone (ROCEPHIN)  IV Stopped (06/19/17 2359)     LOS: 3 days    Time spent: 35 minutes.     Alba Cory, MD [facility] Pager [phone]  If 7PM-7AM, please contact night-coverage [email or web address] Password [email or web address] 06/20/2017, 2:13 PM

## 2017-06-21 LAB — BASIC METABOLIC PANEL
ANION GAP: 8 (ref 5–15)
BUN: 57 mg/dL — ABNORMAL HIGH (ref 6–20)
CHLORIDE: 109 mmol/L (ref 101–111)
CO2: 24 mmol/L (ref 22–32)
Calcium: 8.6 mg/dL — ABNORMAL LOW (ref 8.9–10.3)
Creatinine, Ser: 1.82 mg/dL — ABNORMAL HIGH (ref 0.61–1.24)
GFR calc non Af Amer: 31 mL/min — ABNORMAL LOW (ref >60)
GFR, EST AFRICAN AMERICAN: 35 mL/min — AB (ref >60)
Glucose, Bld: 96 mg/dL (ref 65–99)
POTASSIUM: 3.4 mmol/L — AB (ref 3.5–5.1)
Sodium: 141 mmol/L (ref 135–145)

## 2017-06-21 MED ORDER — AMOXICILLIN-POT CLAVULANATE 875-125 MG PO TABS: 1.0000 | ORAL_TABLET | Freq: Two times a day (BID) | ORAL | Status: DC

## 2017-06-21 MED ORDER — AMOXICILLIN-POT CLAVULANATE 500-125 MG PO TABS
1.0000 | ORAL_TABLET | Freq: Two times a day (BID) | ORAL | Status: DC
  Administered 2017-06-21 – 2017-06-22 (×2): 500 mg via ORAL
  Filled 2017-06-21 (×2): qty 1

## 2017-06-21 MED ORDER — POTASSIUM CHLORIDE CRYS ER 20 MEQ PO TBCR
20.0000 meq | EXTENDED_RELEASE_TABLET | Freq: Once | ORAL | Status: AC
  Administered 2017-06-21: 20 meq via ORAL
  Filled 2017-06-21: qty 1

## 2017-06-21 NOTE — Progress Notes (Signed)
PHARMACY NOTE:  ANTIMICROBIAL RENAL DOSAGE ADJUSTMENT  Current antimicrobial regimen includes a mismatch between antimicrobial dosage and estimated renal function.  As per policy approved by the Pharmacy & Therapeutics and Medical Executive Committees, the antimicrobial dosage will be adjusted accordingly.  Current antimicrobial dosage:  augmentin  Indication: PNA, UTI  Renal Function:  Estimated Creatinine Clearance: 24.7 mL/min (A) (by C-G formula based on SCr of 1.82 mg/dL (H)). []      On intermittent HD, scheduled: []      On CRRT    Antimicrobial dosage has been changed to:  augmentin 500 mg bid    Thank you for allowing pharmacy to be a part of this patient's care.  Lucia Gaskinsham, Dontavis Tschantz P, [facility] CenterRPH 06/21/2017 1:57 PM

## 2017-06-21 NOTE — NC FL2 (Signed)
[place] MEDICAID FL2 LEVEL OF CARE SCREENING TOOL     IDENTIFICATION  Patient Name: Clayton Hoffman Birthdate: Jun 18, 1922 Sex: male Admission Date (Current Location): 06/17/2017  [facility] and [place] Number:  Producer, television/film/video and Address:  [facility],  [address], [place] [address]      Provider Number: [id number]  Attending Physician Name and Address:  Alba Cory, MD  Relative Name and Phone Number:       Current Level of Care: Hospital Recommended Level of Care: Skilled Nursing Facility Prior Approval Number:    Date Approved/Denied:   PASRR Number:   [id number]   Discharge Plan: SNF    Current Diagnoses: Patient Active Problem List   Diagnosis Date Noted  . Acute respiratory failure with hypoxia (HCC)   . AKI (acute kidney injury) (HCC)   . Community acquired pneumonia   . Sepsis due to pneumonia (HCC) 06/17/2017  . Acute on chronic respiratory failure with hypoxia (HCC) 06/17/2017  . Urinary tract infection with hematuria 06/17/2017  . Hyponatremia 06/17/2017  . Elevated troponin 06/17/2017  . Acute renal failure (ARF) (HCC) 06/17/2017    Orientation RESPIRATION BLADDER Height & Weight     Self, Time, Situation, Place  O2 (1L) Continent Weight: 148 lb 8 oz (67.4 kg) Height:  5\' 10"  (177.8 cm)  BEHAVIORAL SYMPTOMS/MOOD NEUROLOGICAL BOWEL NUTRITION STATUS      Continent Diet (See Dc orders)  Diet recommendations: Regular;Thin liquid Liquids provided via: Cup;No straw Medication Administration: Whole meds with puree Compensations: Slow rate;Small sips/bites (rest breaks if needed) Postural Changes and/or Swallow Maneuvers: Seated upright 90 degrees;Upright 30-60 min after meal         AMBULATORY STATUS COMMUNICATION OF NEEDS Skin   Limited Assist (walking 60ftx2) Verbally Normal                       Personal Care Assistance Level of Assistance  Bathing, Feeding, Dressing Bathing Assistance: Limited  assistance Feeding assistance: Independent Dressing Assistance: Limited assistance     Functional Limitations Info  Sight, Hearing, Speech Sight Info: Adequate Hearing Info: Adequate Speech Info: Adequate    SPECIAL CARE FACTORS FREQUENCY  PT (By licensed PT), OT (By licensed OT)     PT Frequency: 5x OT Frequency: 5x            Contractures Contractures Info: Not present    Additional Factors Info  Code Status, Allergies Code Status Info: DNR Allergies Info: NKA           Current Medications (06/21/2017):  This is the current hospital active medication list Current Facility-Administered Medications  Medication Dose Route Frequency Provider Last Rate Last Dose  . amLODipine (NORVASC) tablet 5 mg  5 mg Oral Daily Regalado, Belkys A, MD   5 mg at 06/21/17 0857  . amoxicillin-clavulanate (AUGMENTIN) 500-125 MG per tablet 500 mg  1 tablet Oral Q12H Regalado, Belkys A, MD      . aspirin EC tablet 81 mg  81 mg Oral Daily Regalado, Belkys A, MD   81 mg at 06/21/17 0857  . atenolol (TENORMIN) tablet 50 mg  50 mg Oral Daily Regalado, Belkys A, MD   50 mg at 06/21/17 0857  . azithromycin (ZITHROMAX) tablet 500 mg  500 mg Oral QHS Regalado, Belkys A, MD   500 mg at 06/21/17 0024  . guaiFENesin-dextromethorphan (ROBITUSSIN DM) 100-10 MG/5ML syrup 5 mL  5 mL Oral Q4H PRN Regalado, Belkys A, MD      .  heparin injection 5,000 Units  5,000 Units Subcutaneous Q8H Opyd, Lavone Neriimothy S, MD   5,000 Units at 06/21/17 0741  . HYDROcodone-acetaminophen (NORCO/VICODIN) 5-325 MG per tablet 0.5 tablet  0.5 tablet Oral BID PRN Regalado, Belkys A, MD   0.5 tablet at 06/19/17 0835  . MEDLINE mouth rinse  15 mL Mouth Rinse BID Regalado, Belkys A, MD   15 mL at 06/21/17 1220  . polyethylene glycol (MIRALAX / GLYCOLAX) packet 17 g  17 g Oral BID Regalado, Belkys A, MD   17 g at 06/21/17 1219  . sodium bicarbonate tablet 1,300 mg  1,300 mg Oral Q8H Regalado, Belkys A, MD   1,300 mg at 06/21/17 0857      Discharge Medications: Please see discharge summary for a list of discharge medications.  Relevant Imaging Results:  Relevant Lab Results:   Additional Information SSN:  [id number]  Raye SorrowCoble, Kaylenn Civil N, [place]

## 2017-06-21 NOTE — Progress Notes (Signed)
PROGRESS NOTE    Clayton Hoffman  ZOX:[id number] DOB: 05/14/1922 DOA: 06/17/2017 PCP: Patient, [name]   Brief Narrative: Clayton Hoffman is a 83 y.o. male with medical history significant for hypertension, now presenting to the emergency department with fevers, shortness of breath, productive cough, and lethargy.   Upon arrival to the ED, patient is found to be febrile to 38.9 C, saturating 81% on room air, tachypneic, slightly tachycardic, and was stable blood pressure. EKG features a sinus rhythm with PACs and nonspecific repolarization abnormality. Chest x-ray is notable for bibasilar airspace disease, left greater than right, suspicious for pneumonia. Chemistry panel reveals a sodium of 132, bicarbonate of 19, BUN of 90, and serum creatinine of 3.04 with no prior chemistry panel available for comparison. CBC features a leukocytosis to 20,900, lactic acid is elevated to 2.00, troponin is mildly elevated to 0.10, BNP is also elevated to 745. Urinalysis is highly suggestive of infection   Assessment & Plan:   Principal Problem:   Sepsis due to pneumonia [facility](HCC) Active Problems:   Acute on chronic respiratory failure with hypoxia (HCC)   Urinary tract infection with hematuria   Hyponatremia   Elevated troponin   Acute renal failure (ARF) (HCC)   Acute respiratory failure with hypoxia (HCC)   AKI (acute kidney injury) (HCC)   Community acquired pneumonia   1-Sepsis secondary to PNA, UTI Treated with ceftriaxone and Azithromycin. Change ceftriaxone to Augmentin  urine culture e coli , and blood culture.no growth to date.   WBC trend; 20---18--16--12 Stop vancomycin.   Acute hypoxic Respiratory failure;  Secondary to PNA.  Patient was placed on BPAP in the ED,.  On 2 L oxygen    Acute renal failure; related to sepsis, hypovolemia.  Continue with IV fluids.  Renal US; no hydronephrosis.  Cr peak to 3--it has decreased to 2.6--2.3--1.8. Monitor off IV fluids.  Metabolic  acidosis; started  oral bicarb. Improved. Will decreased bicarb dose.   Hyponatremia; resolved with fluid.   Hypokalemia; replete orally.   Elevated troponin;  Likely demand ischemia.   Mild transaminases; trending down.   Constipation; started miralax. Had BM   DVT prophylaxis: Heparin  Code Status: DNR Family Communication: care discussed with son  Disposition Plan: SNF in 24 hours.    Consultants:  None   Procedures:  Renal US; negative for hydronephrosis.    Antimicrobials: Vancomycin 7-04       Ceftriaxone 7-04       Azithromycin 7-04   Subjective: Agree to go to SNF Dyspnea improved.     Objective: Vitals:   06/21/17 0500 06/21/17 0511 06/21/17 1115 06/21/17 1321  BP: (!) 147/52   (!) 144/52  Pulse:    69  Resp: 18   14  Temp: 98.8 F (37.1 C)   97.6 F (36.4 C)  TempSrc: Oral   Oral  SpO2: 100%  99% 98%  Weight:  67.4 kg (148 lb 8 oz)    Height:        Intake/Output Summary (Last 24 hours) at 06/21/17 1528 Last data filed at 06/21/17 1321  Gross per 24 hour  Intake             1025 ml  Output              900 ml  Net              125 ml   Filed Weights   06/18/17 0400 06/20/17 0543 06/21/17 [id number]  Weight: 66.4 kg (146 lb 6.2 oz) 67.4 kg (148 lb 8 oz) 67.4 kg (148 lb 8 oz)    Examination:  General exam: NAD Respiratory system:. Normal respiratory effort, few ronchus Cardiovascular system: S 1, S 2 RRR Gastrointestinal system: BS present, soft, nt Central nervous system: non focal.  Extremities: Symmetric power.  Skin: no rash      Data Reviewed: I have personally reviewed following labs and imaging studies  CBC:  Recent Labs Lab 06/17/17 1708 06/18/17 0502 06/19/17 0318 06/20/17 0521  WBC 20.9* 18.2* 16.7* 12.6*  NEUTROABS 18.2* 15.9*  --   --   HGB 15.7 12.4* 14.1 13.0  HCT 44.0 34.7* 39.0 36.9*  MCV 91.5 89.4 90.1 90.4  PLT 299 215 262 283   Basic Metabolic Panel:  Recent Labs Lab 06/18/17 0502 06/19/17 0318  06/19/17 2008 06/20/17 0521 06/21/17 0450  NA 134* 135 136 136 141  K 3.1* 3.4* 3.1* 3.3* 3.4*  CL 105 107 107 106 109  CO2 18* 15* 19* 21* 24  GLUCOSE 104* 102* 121* 98 96  BUN 83* 74* 70* 64* 57*  CREATININE 2.67* 2.35* 2.16* 2.04* 1.82*  CALCIUM 7.7* 8.2* 8.2* 8.2* 8.6*  MG  --   --  2.0  --   --    GFR: Estimated Creatinine Clearance: 24.7 mL/min (A) (by C-G formula based on SCr of 1.82 mg/dL (H)). Liver Function Tests:  Recent Labs Lab 06/17/17 1708 06/18/17 0502  AST 29 17  ALT 18 13*  ALKPHOS 132* 80  BILITOT 1.3* 1.3*  PROT 7.4 5.7*  ALBUMIN 3.0* 2.2*   No results for input(s): LIPASE, AMYLASE in the last 168 hours. No results for input(s): AMMONIA in the last 168 hours. Coagulation Profile: No results for input(s): INR, PROTIME in the last 168 hours. Cardiac Enzymes:  Recent Labs Lab 06/17/17 2227 06/18/17 0502  TROPONINI 0.05* 0.03*   BNP (last 3 results) No results for input(s): PROBNP in the last 8760 hours. HbA1C: No results for input(s): HGBA1C in the last 72 hours. CBG: No results for input(s): GLUCAP in the last 168 hours. Lipid Profile: No results for input(s): CHOL, HDL, LDLCALC, TRIG, CHOLHDL, LDLDIRECT in the last 72 hours. Thyroid Function Tests: No results for input(s): TSH, T4TOTAL, FREET4, T3FREE, THYROIDAB in the last 72 hours. Anemia Panel: No results for input(s): VITAMINB12, FOLATE, FERRITIN, TIBC, IRON, RETICCTPCT in the last 72 hours. Sepsis Labs:  Recent Labs Lab 06/17/17 1715 06/17/17 2039  LATICACIDVEN 2.00* 1.7    Recent Results (from the past 240 hour(s))  Urine culture     Status: Abnormal   Collection Time: 06/17/17  5:00 PM  Result Value Ref Range Status   Specimen Description URINE, CLEAN CATCH  Final   Special Requests NONE  Final   Culture 20,000 COLONIES/mL ESCHERICHIA COLI (A)  Final   Report Status 06/20/2017 FINAL  Final   Organism ID, Bacteria ESCHERICHIA COLI (A)  Final      Susceptibility    Escherichia coli - MIC*    AMPICILLIN 4 SENSITIVE Sensitive     CEFAZOLIN <=4 SENSITIVE Sensitive     CEFTRIAXONE <=1 SENSITIVE Sensitive     CIPROFLOXACIN <=0.25 SENSITIVE Sensitive     GENTAMICIN <=1 SENSITIVE Sensitive     IMIPENEM <=0.25 SENSITIVE Sensitive     NITROFURANTOIN <=16 SENSITIVE Sensitive     TRIMETH/SULFA <=20 SENSITIVE Sensitive     AMPICILLIN/SULBACTAM <=2 SENSITIVE Sensitive     PIP/TAZO <=4 SENSITIVE Sensitive  Extended ESBL NEGATIVE Sensitive     * 20,000 COLONIES/mL ESCHERICHIA COLI  Blood Culture (routine x 2)     Status: None (Preliminary result)   Collection Time: 06/17/17  5:08 PM  Result Value Ref Range Status   Specimen Description BLOOD LEFT ANTECUBITAL  Final   Special Requests   Final    BOTTLES DRAWN AEROBIC AND ANAEROBIC Blood Culture adequate volume   Culture   Final    NO GROWTH 4 DAYS Performed at [facility], [address] [place], [place] [address]    Report Status PENDING  Incomplete  Blood Culture (routine x 2)     Status: None (Preliminary result)   Collection Time: 06/17/17  5:20 PM  Result Value Ref Range Status   Specimen Description BLOOD  Final   Special Requests   Final    BOTTLES DRAWN AEROBIC AND ANAEROBIC Blood Culture adequate volume   Culture   Final    NO GROWTH 4 DAYS Performed at [facility], [address], [place], [place] [address]    Report Status PENDING  Incomplete  Culture, sputum-assessment     Status: None   Collection Time: 06/17/17  7:51 PM  Result Value Ref Range Status   Specimen Description SPUTUM  Final   Special Requests NONE  Final   Sputum evaluation   Final    Sputum specimen not acceptable for testing.  Please recollect.   CALLED LISA RN [phone] 06/19/17 A NAVARRO    Report Status 06/19/2017 FINAL  Final  MRSA PCR Screening     Status: None   Collection Time: 06/17/17 10:00 PM  Result Value Ref Range Status   MRSA by PCR NEGATIVE NEGATIVE Final    Comment:        The GeneXpert  MRSA Assay (FDA approved for NASAL specimens only), is one component of a comprehensive MRSA colonization surveillance program. It is not intended to diagnose MRSA infection nor to guide or monitor treatment for MRSA infections.          Radiology Studies: No results found.      Scheduled Meds: . amLODipine  5 mg Oral Daily  . amoxicillin-clavulanate  1 tablet Oral Q12H  . aspirin EC  81 mg Oral Daily  . atenolol  50 mg Oral Daily  . azithromycin  500 mg Oral QHS  . heparin  5,000 Units Subcutaneous Q8H  . mouth rinse  15 mL Mouth Rinse BID  . polyethylene glycol  17 g Oral BID  . sodium bicarbonate  1,300 mg Oral Q8H   Continuous Infusions:    LOS: 4 days    Time spent: 35 minutes.     Alba Cory, MD [facility] Pager [phone]  If 7PM-7AM, please contact night-coverage [email or web address] Password [email or web address] 06/21/2017, 3:28 PM

## 2017-06-21 NOTE — Clinical Social Work Note (Signed)
Clinical Social Work Assessment  Patient Details  Name: Clayton Hoffman MRN: [id number] Date of Birth: 1922/01/14  Date of referral:  06/21/17               Reason for consult:  Facility Placement                Permission sought to share information with:  Family Supports Permission granted to share information::  Yes, Verbal Permission Granted  Name::     son Billee Cashing::     Relationship::     Contact Information:     Housing/Transportation Living arrangements for the past 2 months:  Single Family Home Source of Information:  Patient, Medical Team Patient Interpreter Needed:  None Criminal Activity/Legal Involvement Pertinent to Current Situation/Hospitalization:  No - Comment as needed Significant Relationships:  Adult Children, Other Family Members Lives with:  Self Do you feel safe going back to the place where you live?  Yes Need for family participation in patient care:  No (Coment)  Care giving concerns:  Pt from home where he resides alone. "Up until a few months ago I was caring for my wife at home but that got to be more than I could handle so she lives in assisted living and I'm at home, mowing, gardening, cooking, everything." Pt states she hopes that he can return home after participating in [place] rehab.    Social Worker assessment / plan:  CSW consulted for potential SNF placement. Informed per MD pt now agreeable to SNF for [place] rehab at [place]. CSW met with pt at bedside and discussed referral process (pt familiar due to wife having been to SNF for rehab several years ago). Consented to [facility] making referrals.  PASSR obtained and FL2/referral submitted to area facilities. Pt states he does not have a facility preference but CSW provided facility list and pt states he will discuss when son visits him tonight.   Plan: SNF at [place]. Will follow up with pt/son for bed offers  Employment status:  Retired Nurse, adult PT Recommendations:  [place] / Referral to community resources:  [place]  Patient/Family's Response to care:  Pt appreciative of care received in hospital. "This is great staff here taking care of me."  Patient/Family's Understanding of and Emotional Response to Diagnosis, Current Treatment, and Prognosis:  Pt demonstrates adequate understanding of prognosis and plan. Is somewhat anxious about how he "now is on oxygen, was never on it before," and is positive  that he feels he will be able to progress with rehab and return home quickly. Also seems realistic about "getting older and needing more help."  Emotional Assessment Appearance:  Appears stated age Attitude/Demeanor/Rapport:   (pleasant) Affect (typically observed):  Accepting, Adaptable Orientation:  Oriented to Self, Oriented to Place, Oriented to  Time, Oriented to Situation Alcohol / Substance use:  Not Applicable Psych involvement (Current and /or in the community):  No (Comment)  Discharge Needs  Concerns to be addressed:  Discharge Planning Concerns Readmission within the last 30 days:  No Current discharge risk:  None Barriers to Discharge:  Continued Medical Work up   [employer], LCSW 06/21/2017, 3:44 PM  Weekend coverage [phone]

## 2017-06-22 LAB — BASIC METABOLIC PANEL
ANION GAP: 11 (ref 5–15)
BUN: 54 mg/dL — ABNORMAL HIGH (ref 6–20)
CHLORIDE: 104 mmol/L (ref 101–111)
CO2: 22 mmol/L (ref 22–32)
Calcium: 8.5 mg/dL — ABNORMAL LOW (ref 8.9–10.3)
Creatinine, Ser: 1.54 mg/dL — ABNORMAL HIGH (ref 0.61–1.24)
GFR calc non Af Amer: 37 mL/min — ABNORMAL LOW (ref >60)
GFR, EST AFRICAN AMERICAN: 43 mL/min — AB (ref >60)
Glucose, Bld: 104 mg/dL — ABNORMAL HIGH (ref 65–99)
Potassium: 3.3 mmol/L — ABNORMAL LOW (ref 3.5–5.1)
Sodium: 137 mmol/L (ref 135–145)

## 2017-06-22 LAB — CULTURE, BLOOD (ROUTINE X 2)
CULTURE: NO GROWTH
Culture: NO GROWTH
Special Requests: ADEQUATE
Special Requests: ADEQUATE

## 2017-06-22 MED ORDER — AZITHROMYCIN 500 MG PO TABS: 500.0000 mg | ORAL_TABLET | Freq: Every day | ORAL | 0 refills | Status: AC

## 2017-06-22 MED ORDER — HYDROCODONE-ACETAMINOPHEN 5-325 MG PO TABS: 0.5000 | ORAL_TABLET | Freq: Two times a day (BID) | ORAL | 0 refills | Status: DC | PRN

## 2017-06-22 MED ORDER — AMOXICILLIN-POT CLAVULANATE 500-125 MG PO TABS: 1.0000 | ORAL_TABLET | Freq: Two times a day (BID) | ORAL | 0 refills | Status: DC

## 2017-06-22 MED ORDER — AMLODIPINE BESYLATE 5 MG PO TABS: 5.0000 mg | ORAL_TABLET | Freq: Every day | ORAL | 0 refills | Status: DC

## 2017-06-22 MED ORDER — POTASSIUM CHLORIDE CRYS ER 20 MEQ PO TBCR
40.0000 meq | EXTENDED_RELEASE_TABLET | Freq: Once | ORAL | Status: AC
  Administered 2017-06-22: 40 meq via ORAL
  Filled 2017-06-22: qty 2

## 2017-06-22 NOTE — Care Management Note (Signed)
Case Management Note  Patient Details  Name: Clayton Hoffman MRN: [id number] Date of Birth: 10/26/1922  Subjective/Objective:                    Action/Plan:d/c SNF.   Expected Discharge Date:  06/22/17               Expected Discharge Plan:  Skilled Nursing Facility  In-House Referral:  Clinical Social Work  Discharge planning Services  CM Consult  Post Acute Care Choice:    Choice offered to:     DME Arranged:    DME Agency:     HH Arranged:    HH Agency:     Status of Service:  Completed, signed off  If discussed at [employer] Length of [employer] Meetings, dates discussed:    Additional Comments:  Clayton Hoffman, Clayton Scarpelli, RN 06/22/2017, 10:34 AM

## 2017-06-22 NOTE — Clinical Social Work Placement (Signed)
[facility].  Report # for RN- [phone] All information provided to facility via the HUB Pt will transport via PTAR- CSW completed medical necessity form and arranged transport Family notified- son [name]  See below for placement details   CLINICAL SOCIAL WORK PLACEMENT  NOTE  Date:  06/22/2017  Patient Details  Name: Clayton Hoffman MRN: [id number] Date of Birth: 05/01/1922  Clinical Social Work is seeking post-discharge placement for this patient at the Skilled  Nursing Facility level of care (*CSW will initial, date and re-position this form in  chart as items are completed):  Yes   Patient/family provided with [place] Clinical Social Work Department's list of facilities offering this level of care within the geographic area requested by the patient (or if unable, by the patient's family).  Yes   Patient/family informed of their freedom to choose among providers that offer the needed level of care, that participate in Medicare, Medicaid or managed care program needed by the patient, have an available bed and are willing to accept the patient.  Yes   Patient/family informed of [facility]'s ownership interest in [facility] and [facility], as well as of the fact that they are under no obligation to receive care at these facilities.  PASRR submitted to EDS on 06/21/17     PASRR number received on 06/21/17     Existing PASRR number confirmed on       FL2 transmitted to all facilities in geographic area requested by pt/family on 06/21/17     FL2 transmitted to all facilities within larger geographic area on       Patient informed that his/her managed care company has contracts with or will negotiate with certain facilities, including the following:        Yes   Patient/family informed of bed offers received.  [facility]     Physician recommends and patient [facility]     [facility].  Patient to be transferred to facility by PTAR     Patient family notified on 06/22/17 of transfer.  Name of family member notified:  Son [name]     PHYSICIAN Please sign DNR     Additional Comment:    _______________________________________________ Nelwyn SalisburyMeghan R Kazden Largo, LCSW 06/22/2017, 12:37 PM  [phone]

## 2017-06-22 NOTE — Care Management Important Message (Signed)
Important Message  Patient Details  Name: Clayton FootsLucius W Knodel MRN: [id number] Date of Birth: 03/09/1922   Medicare Important Message Given:  Yes    Caren MacadamFuller, Marilla Boddy 06/22/2017, 10:19 AMImportant Message  Patient Details  Name: Clayton FootsLucius W Meldrum MRN: [id number] Date of Birth: 06/23/1922   Medicare Important Message Given:  Yes    Caren MacadamFuller, Temia Debroux 06/22/2017, 10:19 AM

## 2017-06-22 NOTE — Progress Notes (Signed)
Physical Therapy Treatment Patient Details Name: Clayton FootsLucius W Hoffman MRN: [id number] DOB: 08/26/1922 Today's Date: 06/22/2017    History of Present Illness Pt is a 83 y.o. male with PMH of hypertension who presented to the ED with fevers, shortness of breath, productive cough, and lethargy. Pt. admitted to the hospital for sepsis.    PT Comments    Pt responded well to treatment and was able to progress distance while ambulating. Pt did not require O2 today during activity as SPO2 levels remained elevated above 92%.   Follow Up Recommendations  SNF     Equipment Recommendations  None recommended by PT    Recommendations for Other Services       Precautions / Restrictions Precautions Precautions: Fall Precaution Comments: monitor sats Restrictions Weight Bearing Restrictions: No    Mobility  Bed Mobility               General bed mobility comments: oob in recliner  Transfers Overall transfer level: Needs assistance Equipment used: Rolling walker (2 wheeled) Transfers: Sit to/from Stand Sit to Stand: Min assist         General transfer comment: Assist to rise and to control descent, verbal cues given for safety and hand placement  Ambulation/Gait Ambulation/Gait assistance: Min guard Ambulation Distance (Feet): 240 Feet Assistive device: Rolling walker (2 wheeled) Gait Pattern/deviations: Step-through pattern;Decreased stride length     General Gait Details: Pt did not require O2 while walking today as SPO2 remained elevated above 92% during treatment. Pt was able to progress to walking 240 feet with intermittent stabilization   Stairs            Wheelchair Mobility    Modified Rankin (Stroke Patients Only)       Balance Overall balance assessment: Needs assistance         Standing balance support: Bilateral upper extremity supported Standing balance-Leahy Scale: Poor Standing balance comment: Pt required UE support through the use of a RW                            Cognition Arousal/Alertness: Awake/alert Behavior During Therapy: WFL for tasks assessed/performed Overall Cognitive Status: Within Functional Limits for tasks assessed                                        Exercises      General Comments        Pertinent Vitals/Pain Pain Assessment: No/denies pain    Home Living                      Prior Function            PT Goals (current goals can now be found in the care plan section) Acute Rehab PT Goals Patient Stated Goal: Pt would like to return home PT Goal Formulation: With patient Time For Goal Achievement: 07/03/17 Potential to Achieve Goals: Good Progress towards PT goals: Progressing toward goals    Frequency    Min 3X/week      PT Plan Discharge plan needs to be updated    Co-evaluation              AM-PAC PT "6 Clicks" Daily Activity  Outcome Measure  Difficulty turning over in bed (including adjusting bedclothes, sheets and blankets)?: None Difficulty moving from lying on back to sitting  on the side of the bed? : None Difficulty sitting down on and standing up from a chair with arms (e.g., wheelchair, bedside commode, etc,.)?: Total Help needed moving to and from a bed to chair (including a wheelchair)?: A Little Help needed walking in hospital room?: A Little Help needed climbing 3-5 steps with a railing? : A Little 6 Click Score: 18    End of Session Equipment Utilized During Treatment: Gait belt (Simultaneous filing. User may not have seen previous data.) Activity Tolerance: Patient tolerated treatment well Patient left: in chair;with call bell/phone within reach   PT Visit Diagnosis: Muscle weakness (generalized) (M62.81);Difficulty in walking, not elsewhere classified (R26.2)     Time: 1610-9604 PT Time Calculation (min) (ACUTE ONLY): 16 min  Charges:  $Gait Training: 8-22 mins                    G Codes:       Marlene Bast, SPT   Kathrene Bongo 06/22/2017, 12:58 PM

## 2017-06-22 NOTE — Discharge Summary (Signed)
Physician Discharge Summary  EDIE DARLEY ZOX:[id number] DOB: 1922/06/09 DOA: 06/17/2017  PCP: Patient, [name]  Admit date: 06/17/2017 Discharge date: 06/22/2017  Admitted From: Home  Disposition:  SNF  Recommendations for Outpatient Follow-up:  1. Follow up with PCP in 1-2 weeks 2. Please obtain BMP/CBC in one week 3. Please follow up on the following pending results:   Discharge Condition: stable.  CODE STATUS:DNR Diet recommendation: Heart Healthy   Brief/Interim Summary: Clayton Hoffman a 83 y.o.malewith medical history significant for hypertension, now presenting to the emergency department with fevers, shortness of breath, productive cough, and lethargy.   Upon arrival to the ED, patient is found to be febrile to 38.9 C, saturating 81% on room air, tachypneic, slightly tachycardic, and was stable blood pressure. EKG features a sinus rhythm with PACs and nonspecific repolarization abnormality. Chest x-ray is notable for bibasilar airspace disease, left greater than right, suspicious for pneumonia. Chemistry panel reveals a sodium of 132, bicarbonate of 19, BUN of 90, and serum creatinine of 3.04 with no prior chemistry panel available for comparison. CBC features a leukocytosis to 20,900, lactic acid is elevated to 2.00, troponin is mildly elevated to 0.10, BNP is also elevated to 745. Urinalysis is highly suggestive of infection   Assessment & Plan:   Principal Problem:   Sepsis due to pneumonia [facility]) Active Problems:   Acute on chronic respiratory failure with hypoxia (HCC)   Urinary tract infection with hematuria   Hyponatremia   Elevated troponin   Acute renal failure (ARF) (HCC)   Acute respiratory failure with hypoxia (HCC)   AKI (acute kidney injury) (HCC)   Community acquired pneumonia   1-Sepsis secondary to PNA, UTI Treated with ceftriaxone and Azithromycin. Change ceftriaxone to Augmentin  urine culture e coli , and blood culture.no growth to date.    WBC trend; 20---18--16--12 Stop vancomycin.  Discharge on 2 more days of antibiotics.   Acute hypoxic Respiratory failure;  Secondary to PNA.  Patient was placed on BPAP in the ED,. Over hospitalization respiratory failure improved.  On 2 L oxygen now off oxygen.    Acute renal failure; related to sepsis, hypovolemia.  Continue with IV fluids.  Renal US; no hydronephrosis.  Cr peak to 3--it has decreased to 2.6--2.3--1.8. Monitor off IV fluids.  Received oral bicarb for metabolic acidosis, this has resolved.   Hyponatremia; resolved with fluid.   Hypokalemia; replete orally. Repeat labs   Elevated troponin;  Likely demand ischemia.   Mild transaminases; trending down.   Constipation; started miralax. Had BM   Discharge Diagnoses:  Principal Problem:   Sepsis due to pneumonia [facility]) Active Problems:   Acute on chronic respiratory failure with hypoxia (HCC)   Urinary tract infection with hematuria   Hyponatremia   Elevated troponin   Acute renal failure (ARF) (HCC)   Acute respiratory failure with hypoxia (HCC)   AKI (acute kidney injury) (HCC)   Community acquired pneumonia    Discharge Instructions  Discharge Instructions    Diet - low sodium heart healthy    Complete by:  As directed    Increase activity slowly    Complete by:  As directed      Allergies as of 06/22/2017   No Known Allergies     Medication List    STOP taking these medications   hydrochlorothiazide 12.5 MG capsule Commonly known as:  MICROZIDE   promethazine 12.5 MG tablet Commonly known as:  PHENERGAN     TAKE these medications  amLODipine 5 MG tablet Commonly known as:  NORVASC Take 1 tablet (5 mg total) by mouth daily. What changed:  medication strength  how much to take   amoxicillin-clavulanate 500-125 MG tablet Commonly known as:  AUGMENTIN Take 1 tablet (500 mg total) by mouth every 12 (twelve) hours.   aspirin EC 81 MG tablet Take 81 mg by mouth  daily.   atenolol 50 MG tablet Commonly known as:  TENORMIN Take 50 mg by mouth daily.   azithromycin 500 MG tablet Commonly known as:  ZITHROMAX Take 1 tablet (500 mg total) by mouth daily.   HYDROcodone-acetaminophen 5-325 MG tablet Commonly known as:  NORCO/VICODIN Take 0.5 tablets by mouth 2 (two) times daily as needed for moderate pain. What changed:  how much to take   simvastatin 10 MG tablet Commonly known as:  ZOCOR Take 10 mg by mouth daily.   TYLENOL 8 HOUR ARTHRITIS PAIN 650 MG CR tablet Generic drug:  acetaminophen Take 650 mg by mouth every 8 (eight) hours as needed for pain.       No Known Allergies  Consultations: none  Procedures/Studies: US Renal  Result Date: 06/18/2017 CLINICAL DATA:  Acute onset of renal insufficiency. Initial encounter. EXAM: RENAL / URINARY TRACT ULTRASOUND COMPLETE COMPARISON:  Renal ultrasound performed 06/05/2006 FINDINGS: Right Kidney: Length: 9.5 cm. Mildly increased parenchymal echogenicity is noted. No mass or hydronephrosis visualized. Left Kidney: Length: 10.4 cm. Mildly increased parenchymal echogenicity is noted. No mass or hydronephrosis visualized. Bladder: Appears normal for degree of bladder distention. Bilateral ureteral jets are visualized. IMPRESSION: 1. No evidence of hydronephrosis. 2. Mildly increased renal parenchymal echogenicity raises question for medical renal disease. Electronically Signed   By: Roanna Raider M.D.   On: 06/18/2017 05:52   Dg Chest Port 1 View  Result Date: 06/17/2017 CLINICAL DATA:  Shortness of breath EXAM: PORTABLE CHEST 1 VIEW COMPARISON:  None. FINDINGS: 1656 hours. Low lung volumes. There is bibasilar airspace disease, left greater than right. The cardiopericardial silhouette is within normal limits for size. Bones are diffusely demineralized. Telemetry leads overlie the chest. IMPRESSION: Bibasilar airspace disease, left greater than right, suspicious for pneumonia. Follow-up imaging  recommended to ensure resolution. Electronically Signed   By: Kennith Center M.D.   On: 06/17/2017 17:19       Subjective: He is feeling better, dyspnea improve/   Discharge Exam: Vitals:   06/21/17 2028 06/22/17 0506  BP: (!) 163/70 (!) 149/67  Pulse: 75 69  Resp: 16 17  Temp: 99.2 F (37.3 C) 99.3 F (37.4 C)   Vitals:   06/21/17 1115 06/21/17 1321 06/21/17 2028 06/22/17 0506  BP:  (!) 144/52 (!) 163/70 (!) 149/67  Pulse:  69 75 69  Resp:  14 16 17   Temp:  97.6 F (36.4 C) 99.2 F (37.3 C) 99.3 F (37.4 C)  TempSrc:  Oral Oral Oral  SpO2: 99% 98% 97% 98%  Weight:    68.1 kg (150 lb 3.2 oz)  Height:        General: Pt is alert, awake, not in acute distress Cardiovascular: RRR, S1/S2 +, no rubs, no gallops Respiratory: CTA bilaterally, no wheezing, no rhonchi Abdominal: Soft, NT, ND, bowel sounds + Extremities: no edema, no cyanosis    The results of significant diagnostics from this hospitalization (including imaging, microbiology, ancillary and laboratory) are listed below for reference.     Microbiology: Recent Results (from the past 240 hour(s))  Urine culture     Status: Abnormal  Collection Time: 06/17/17  5:00 PM  Result Value Ref Range Status   Specimen Description URINE, CLEAN CATCH  Final   Special Requests NONE  Final   Culture 20,000 COLONIES/mL ESCHERICHIA COLI (A)  Final   Report Status 06/20/2017 FINAL  Final   Organism ID, Bacteria ESCHERICHIA COLI (A)  Final      Susceptibility   Escherichia coli - MIC*    AMPICILLIN 4 SENSITIVE Sensitive     CEFAZOLIN <=4 SENSITIVE Sensitive     CEFTRIAXONE <=1 SENSITIVE Sensitive     CIPROFLOXACIN <=0.25 SENSITIVE Sensitive     GENTAMICIN <=1 SENSITIVE Sensitive     IMIPENEM <=0.25 SENSITIVE Sensitive     NITROFURANTOIN <=16 SENSITIVE Sensitive     TRIMETH/SULFA <=20 SENSITIVE Sensitive     AMPICILLIN/SULBACTAM <=2 SENSITIVE Sensitive     PIP/TAZO <=4 SENSITIVE Sensitive     Extended ESBL NEGATIVE  Sensitive     * 20,000 COLONIES/mL ESCHERICHIA COLI  Blood Culture (routine x 2)     Status: None (Preliminary result)   Collection Time: 06/17/17  5:08 PM  Result Value Ref Range Status   Specimen Description BLOOD LEFT ANTECUBITAL  Final   Special Requests   Final    BOTTLES DRAWN AEROBIC AND ANAEROBIC Blood Culture adequate volume   Culture   Final    NO GROWTH 4 DAYS Performed at [facility], [address] [place], [place] [address]    Report Status PENDING  Incomplete  Blood Culture (routine x 2)     Status: None (Preliminary result)   Collection Time: 06/17/17  5:20 PM  Result Value Ref Range Status   Specimen Description BLOOD  Final   Special Requests   Final    BOTTLES DRAWN AEROBIC AND ANAEROBIC Blood Culture adequate volume   Culture   Final    NO GROWTH 4 DAYS Performed at [facility], [address] [place], [place] [address]    Report Status PENDING  Incomplete  Culture, sputum-assessment     Status: None   Collection Time: 06/17/17  7:51 PM  Result Value Ref Range Status   Specimen Description SPUTUM  Final   Special Requests NONE  Final   Sputum evaluation   Final    Sputum specimen not acceptable for testing.  Please recollect.   CALLED LISA RN [phone] 06/19/17 A NAVARRO    Report Status 06/19/2017 FINAL  Final  MRSA PCR Screening     Status: None   Collection Time: 06/17/17 10:00 PM  Result Value Ref Range Status   MRSA by PCR NEGATIVE NEGATIVE Final    Comment:        The GeneXpert MRSA Assay (FDA approved for NASAL specimens only), is one component of a comprehensive MRSA colonization surveillance program. It is not intended to diagnose MRSA infection nor to guide or monitor treatment for MRSA infections.      Labs: BNP (last 3 results)  Recent Labs  06/17/17 1708  BNP 744.7*   Basic Metabolic Panel:  Recent Labs Lab 06/19/17 0318 06/19/17 2008 06/20/17 0521 06/21/17 0450 06/22/17 0435  NA 135 136 136 141 137  K  3.4* 3.1* 3.3* 3.4* 3.3*  CL 107 107 106 109 104  CO2 15* 19* 21* 24 22  GLUCOSE 102* 121* 98 96 104*  BUN 74* 70* 64* 57* 54*  CREATININE 2.35* 2.16* 2.04* 1.82* 1.54*  CALCIUM 8.2* 8.2* 8.2* 8.6* 8.5*  MG  --  2.0  --   --   --  Liver Function Tests:  Recent Labs Lab 06/17/17 1708 06/18/17 0502  AST 29 17  ALT 18 13*  ALKPHOS 132* 80  BILITOT 1.3* 1.3*  PROT 7.4 5.7*  ALBUMIN 3.0* 2.2*   No results for input(s): LIPASE, AMYLASE in the last 168 hours. No results for input(s): AMMONIA in the last 168 hours. CBC:  Recent Labs Lab 06/17/17 1708 06/18/17 0502 06/19/17 0318 06/20/17 0521  WBC 20.9* 18.2* 16.7* 12.6*  NEUTROABS 18.2* 15.9*  --   --   HGB 15.7 12.4* 14.1 13.0  HCT 44.0 34.7* 39.0 36.9*  MCV 91.5 89.4 90.1 90.4  PLT 299 215 262 283   Cardiac Enzymes:  Recent Labs Lab 06/17/17 2227 06/18/17 0502  TROPONINI 0.05* 0.03*   BNP: Invalid input(s): POCBNP CBG: No results for input(s): GLUCAP in the last 168 hours. D-Dimer No results for input(s): DDIMER in the last 72 hours. Hgb A1c No results for input(s): HGBA1C in the last 72 hours. Lipid Profile No results for input(s): CHOL, HDL, LDLCALC, TRIG, CHOLHDL, LDLDIRECT in the last 72 hours. Thyroid function studies No results for input(s): TSH, T4TOTAL, T3FREE, THYROIDAB in the last 72 hours.  Invalid input(s): FREET3 Anemia work up No results for input(s): VITAMINB12, FOLATE, FERRITIN, TIBC, IRON, RETICCTPCT in the last 72 hours. Urinalysis    Component Value Date/Time   COLORURINE BROWN (A) 06/17/2017 1700   APPEARANCEUR TURBID (A) 06/17/2017 1700   LABSPEC 1.020 06/17/2017 1700   PHURINE 5.0 06/17/2017 1700   GLUCOSEU NEGATIVE 06/17/2017 1700   HGBUR LARGE (A) 06/17/2017 1700   BILIRUBINUR SMALL (A) 06/17/2017 1700   KETONESUR NEGATIVE 06/17/2017 1700   PROTEINUR 100 (A) 06/17/2017 1700   NITRITE NEGATIVE 06/17/2017 1700   LEUKOCYTESUR LARGE (A) 06/17/2017 1700   Sepsis  Labs Invalid input(s): PROCALCITONIN,  WBC,  LACTICIDVEN Microbiology Recent Results (from the past 240 hour(s))  Urine culture     Status: Abnormal   Collection Time: 06/17/17  5:00 PM  Result Value Ref Range Status   Specimen Description URINE, CLEAN CATCH  Final   Special Requests NONE  Final   Culture 20,000 COLONIES/mL ESCHERICHIA COLI (A)  Final   Report Status 06/20/2017 FINAL  Final   Organism ID, Bacteria ESCHERICHIA COLI (A)  Final      Susceptibility   Escherichia coli - MIC*    AMPICILLIN 4 SENSITIVE Sensitive     CEFAZOLIN <=4 SENSITIVE Sensitive     CEFTRIAXONE <=1 SENSITIVE Sensitive     CIPROFLOXACIN <=0.25 SENSITIVE Sensitive     GENTAMICIN <=1 SENSITIVE Sensitive     IMIPENEM <=0.25 SENSITIVE Sensitive     NITROFURANTOIN <=16 SENSITIVE Sensitive     TRIMETH/SULFA <=20 SENSITIVE Sensitive     AMPICILLIN/SULBACTAM <=2 SENSITIVE Sensitive     PIP/TAZO <=4 SENSITIVE Sensitive     Extended ESBL NEGATIVE Sensitive     * 20,000 COLONIES/mL ESCHERICHIA COLI  Blood Culture (routine x 2)     Status: None (Preliminary result)   Collection Time: 06/17/17  5:08 PM  Result Value Ref Range Status   Specimen Description BLOOD LEFT ANTECUBITAL  Final   Special Requests   Final    BOTTLES DRAWN AEROBIC AND ANAEROBIC Blood Culture adequate volume   Culture   Final    NO GROWTH 4 DAYS Performed at [facility], [address], [place], [place] [address]    Report Status PENDING  Incomplete  Blood Culture (routine x 2)     Status: None (Preliminary result)  Collection Time: 06/17/17  5:20 PM  Result Value Ref Range Status   Specimen Description BLOOD  Final   Special Requests   Final    BOTTLES DRAWN AEROBIC AND ANAEROBIC Blood Culture adequate volume   Culture   Final    NO GROWTH 4 DAYS Performed at [facility], [address] [place], [place] [address]    Report Status PENDING  Incomplete  Culture, sputum-assessment     Status: None   Collection Time:  06/17/17  7:51 PM  Result Value Ref Range Status   Specimen Description SPUTUM  Final   Special Requests NONE  Final   Sputum evaluation   Final    Sputum specimen not acceptable for testing.  Please recollect.   CALLED LISA RN [phone] 06/19/17 A NAVARRO    Report Status 06/19/2017 FINAL  Final  MRSA PCR Screening     Status: None   Collection Time: 06/17/17 10:00 PM  Result Value Ref Range Status   MRSA by PCR NEGATIVE NEGATIVE Final    Comment:        The GeneXpert MRSA Assay (FDA approved for NASAL specimens only), is one component of a comprehensive MRSA colonization surveillance program. It is not intended to diagnose MRSA infection nor to guide or monitor treatment for MRSA infections.      Time coordinating discharge: Over 30 minutes  SIGNED:   Alba Coryegalado, Toini Failla A, MD  [facility] 06/22/2017, 10:29 AM Pager   If 7PM-7AM, please contact night-coverage [email or web address] Password [email or web address]

## 2017-06-22 NOTE — Progress Notes (Signed)
Spoke with pt's sone via phone- provided bed offers. Son states he and pt discussed options last night and son is making visits today- will call CSW back with facility choice.  Plan: SNF at DC- family making choice today.   Ilean SkillMeghan Mialee Weyman, MSW, LCSW Clinical Social Work 06/22/2017 [phone]

## 2018-10-23 ENCOUNTER — Observation Stay (HOSPITAL_COMMUNITY)
Admission: EM | Admit: 2018-10-23 | Discharge: 2018-10-24 | Disposition: A | Discharge status: Home / Self Care | Payer: Medicare Other | Attending: Internal Medicine | Admitting: Internal Medicine

## 2018-10-23 ENCOUNTER — Other Ambulatory Visit: Payer: Self-pay

## 2018-10-23 ENCOUNTER — Encounter (HOSPITAL_COMMUNITY): Payer: Self-pay | Admitting: *Deleted

## 2018-10-23 ENCOUNTER — Emergency Department (HOSPITAL_COMMUNITY): Payer: Medicare Other

## 2018-10-23 DIAGNOSIS — Z79899 Other long term (current) drug therapy: Secondary | ICD-10-CM | POA: Diagnosis not present

## 2018-10-23 DIAGNOSIS — K922 Gastrointestinal hemorrhage, unspecified: Secondary | ICD-10-CM | POA: Diagnosis not present

## 2018-10-23 DIAGNOSIS — I10 Essential (primary) hypertension: Secondary | ICD-10-CM | POA: Insufficient documentation

## 2018-10-23 DIAGNOSIS — N179 Acute kidney failure, unspecified: Secondary | ICD-10-CM | POA: Diagnosis not present

## 2018-10-23 DIAGNOSIS — K625 Hemorrhage of anus and rectum: Secondary | ICD-10-CM | POA: Diagnosis not present

## 2018-10-23 LAB — CBC WITH DIFFERENTIAL/PLATELET
ABS IMMATURE GRANULOCYTES: 0.03 10*3/uL (ref 0.00–0.07)
Basophils Absolute: 0.1 10*3/uL (ref 0.0–0.1)
Basophils Relative: 1 %
Eosinophils Absolute: 0.1 10*3/uL (ref 0.0–0.5)
Eosinophils Relative: 1 %
HCT: 40.8 % (ref 39.0–52.0)
HEMOGLOBIN: 13 g/dL (ref 13.0–17.0)
Immature Granulocytes: 0 %
LYMPHS ABS: 0.8 10*3/uL (ref 0.7–4.0)
LYMPHS PCT: 9 %
MCH: 31.8 pg (ref 26.0–34.0)
MCHC: 31.9 g/dL (ref 30.0–36.0)
MCV: 99.8 fL (ref 80.0–100.0)
Monocytes Absolute: 0.5 10*3/uL (ref 0.1–1.0)
Monocytes Relative: 6 %
NEUTROS ABS: 7.8 10*3/uL — AB (ref 1.7–7.7)
Neutrophils Relative %: 83 %
Platelets: 163 10*3/uL (ref 150–400)
RBC: 4.09 MIL/uL — AB (ref 4.22–5.81)
RDW: 13.4 % (ref 11.5–15.5)
WBC: 9.3 10*3/uL (ref 4.0–10.5)
nRBC: 0 % (ref 0.0–0.2)

## 2018-10-23 LAB — COMPREHENSIVE METABOLIC PANEL
ALBUMIN: 4.1 g/dL (ref 3.5–5.0)
ALT: 58 U/L — ABNORMAL HIGH (ref 0–44)
AST: 33 U/L (ref 15–41)
Alkaline Phosphatase: 70 U/L (ref 38–126)
Anion gap: 11 (ref 5–15)
BUN: 69 mg/dL — AB (ref 8–23)
CHLORIDE: 114 mmol/L — AB (ref 98–111)
CO2: 14 mmol/L — AB (ref 22–32)
Calcium: 9.1 mg/dL (ref 8.9–10.3)
Creatinine, Ser: 2.4 mg/dL — ABNORMAL HIGH (ref 0.61–1.24)
GFR calc Af Amer: 25 mL/min — ABNORMAL LOW (ref >60)
GFR, EST NON AFRICAN AMERICAN: 22 mL/min — AB (ref >60)
GLUCOSE: 118 mg/dL — AB (ref 70–99)
POTASSIUM: 5 mmol/L (ref 3.5–5.1)
SODIUM: 139 mmol/L (ref 135–145)
Total Bilirubin: 1.1 mg/dL (ref 0.3–1.2)
Total Protein: 7.2 g/dL (ref 6.5–8.1)

## 2018-10-23 LAB — ABO/RH: ABO/RH(D): O POS

## 2018-10-23 LAB — TYPE AND SCREEN
ABO/RH(D): O POS
ANTIBODY SCREEN: NEGATIVE

## 2018-10-23 LAB — PROTIME-INR
INR: 1.16
PROTHROMBIN TIME: 14.7 s (ref 11.4–15.2)

## 2018-10-23 MED ORDER — SODIUM CHLORIDE 0.9 % IV SOLN
INTRAVENOUS | Status: DC
  Administered 2018-10-23: 18:00:00 via INTRAVENOUS

## 2018-10-23 MED ORDER — POLYETHYLENE GLYCOL 3350 17 G PO PACK: 17.0000 g | PACK | Freq: Two times a day (BID) | ORAL | Status: DC

## 2018-10-23 MED ORDER — QUETIAPINE FUMARATE 25 MG PO TABS: 25.0000 mg | ORAL_TABLET | Freq: Every day | ORAL | Status: DC

## 2018-10-23 MED ORDER — SENNOSIDES-DOCUSATE SODIUM 8.6-50 MG PO TABS: 3.0000 | ORAL_TABLET | Freq: Two times a day (BID) | ORAL | Status: DC

## 2018-10-23 MED ORDER — LACTATED RINGERS IV SOLN
INTRAVENOUS | Status: DC
  Administered 2018-10-23: 16:00:00 via INTRAVENOUS

## 2018-10-23 MED ORDER — BISACODYL 5 MG PO TBEC
10.0000 mg | DELAYED_RELEASE_TABLET | Freq: Every day | ORAL | Status: DC
  Administered 2018-10-23: 10 mg via ORAL
  Filled 2018-10-23: qty 2

## 2018-10-23 NOTE — ED Triage Notes (Signed)
Pt complains of bleeding from rectum since yesterday. Pt believes he is bleeding from hemorrhoids.

## 2018-10-23 NOTE — ED Provider Notes (Signed)
Emergency Department Provider Note   I have reviewed the triage vital signs and the nursing notes.   HISTORY  Chief Complaint Rectal Bleeding   HPI Clayton Hoffman is a 84 y.o. male with PMH of HTN and hemorrhoids presents to the emergency department for evaluation of rectal bleeding.  Patient states he has had some itching and rectal discomfort over the past several days.  He thought this was secondary to hemorrhoids which she has had in the past.  Yesterday he began to experience some mild bleeding which has increased today.  He denies any abdominal pain, nausea, vomiting.  He has had constipation.  He is passing flatus and occasional hard stool.  He denies feeling lightheaded, shortness of breath.  No fevers or chills. Patient takes an 81 mg ASA daily.   Past Medical History:  Diagnosis Date  . Hypertension     Patient Active Problem List   Diagnosis Date Noted  . LGI bleed 10/23/2018  . HTN (hypertension) 10/23/2018  . Rectal bleeding   . Acute respiratory failure with hypoxia (HCC)   . AKI (acute kidney injury) (HCC)   . Community acquired pneumonia   . Sepsis due to pneumonia (HCC) 06/17/2017  . Acute on chronic respiratory failure with hypoxia (HCC) 06/17/2017  . Urinary tract infection with hematuria 06/17/2017  . Hyponatremia 06/17/2017  . Elevated troponin 06/17/2017  . Acute renal failure (ARF) (HCC) 06/17/2017    Past Surgical History:  Procedure Laterality Date  . LUNG BIOPSY     Allergies Patient has no known allergies.  History reviewed. No pertinent family history.  Social History Social History   Tobacco Use  . Smoking status: Former Smoker    Years: 40.00    Types: Cigarettes  . Smokeless tobacco: Never Used  Substance Use Topics  . Alcohol use: No  . Drug use: No    Review of Systems  Constitutional: No fever/chills Eyes: No visual changes. ENT: No sore throat. Cardiovascular: Denies chest pain. Respiratory: Denies shortness of  breath. Gastrointestinal: No abdominal pain.  No nausea, no vomiting.  No diarrhea.  No constipation. Positive BRBPR.  Genitourinary: Negative for dysuria. Musculoskeletal: Negative for back pain. Skin: Negative for rash. Neurological: Negative for headaches, focal weakness or numbness.  10-point ROS otherwise negative.  ____________________________________________   PHYSICAL EXAM:  VITAL SIGNS: ED Triage Vitals  Enc Vitals Group     BP 10/23/18 1018 (!) 125/50     Pulse Rate 10/23/18 1018 (!) 51     Resp 10/23/18 1018 18     Temp 10/23/18 1018 97.9 F (36.6 C)     Temp Source 10/23/18 1018 Oral     SpO2 10/23/18 1018 100 %     Pain Score 10/23/18 1020 0   Constitutional: Alert and oriented. Well appearing and in no acute distress. Eyes: Conjunctivae are normal. Head: Atraumatic. Nose: No congestion/rhinnorhea. Mouth/Throat: Mucous membranes are moist.  Oropharynx non-erythematous. Neck: No stridor.  Cardiovascular: Bradycardia. Good peripheral circulation. Grossly normal heart sounds.   Respiratory: Normal respiratory effort.  No retractions. Lungs CTAB. Gastrointestinal: Soft and nontender. No distention. Rectal exam performed without visualized external hemorrhoid. No rectal fissure. Streaky BRB noted on underwear but no active, large volume hemorrhage.  Musculoskeletal: No lower extremity tenderness nor edema. No gross deformities of extremities. Neurologic:  Normal speech and language. No gross focal neurologic deficits are appreciated.  Skin:  Skin is warm, dry and intact. No rash noted.  ____________________________________________   LABS (all labs  ordered are listed, but only abnormal results are displayed)  Labs Reviewed  COMPREHENSIVE METABOLIC PANEL - Abnormal; Notable for the following components:      Result Value   Chloride 114 (*)    CO2 14 (*)    Glucose, Bld 118 (*)    BUN 69 (*)    Creatinine, Ser 2.40 (*)    ALT 58 (*)    GFR calc non Af Amer 22  (*)    GFR calc Af Amer 25 (*)    All other components within normal limits  CBC WITH DIFFERENTIAL/PLATELET - Abnormal; Notable for the following components:   RBC 4.09 (*)    Neutro Abs 7.8 (*)    All other components within normal limits  CBC - Abnormal; Notable for the following components:   RBC 3.87 (*)    Hemoglobin 12.4 (*)    HCT 38.9 (*)    MCV 100.5 (*)    Platelets 137 (*)    All other components within normal limits  BASIC METABOLIC PANEL - Abnormal; Notable for the following components:   Chloride 117 (*)    CO2 14 (*)    BUN 53 (*)    Creatinine, Ser 1.80 (*)    Calcium 8.6 (*)    GFR calc non Af Amer 31 (*)    GFR calc Af Amer 35 (*)    All other components within normal limits  PROTIME-INR  TYPE AND SCREEN  ABO/RH   ____________________________________________  RADIOLOGY  Ct Abdomen Pelvis Wo Contrast  Result Date: 10/23/2018 CLINICAL DATA:  Pt complains of bleeding from rectum since yesterday. Pt believes he is bleeding from hemorrhoids. EXAM: CT ABDOMEN AND PELVIS WITHOUT CONTRAST TECHNIQUE: Multidetector CT imaging of the abdomen and pelvis was performed following the standard protocol without IV contrast. COMPARISON:  None. FINDINGS: Lower chest: Small right and small to moderate left pleural effusions. There is lung base opacity most consistent with atelectasis. Heart mildly enlarged. Hepatobiliary: No focal liver abnormality is seen. No gallstones, gallbladder wall thickening, or biliary dilatation. Pancreas: Unremarkable. No pancreatic ductal dilatation or surrounding inflammatory changes. Spleen: Spleen normal in size. Peripheral calcifications. No masses or focal lesions. Adrenals/Urinary Tract: No adrenal masses. Low-density exophytic mass, upper pole left kidney, 12 mm. Low-density renal sinus mass, midpole right kidney, 14 mm, both consistent with cysts. Renal cortical thinning. No collecting system stones. No hydronephrosis. Ureters normal in course and  in caliber. Bladder is unremarkable. Stomach/Bowel: Portions of the sigmoid and left transverse colon extend into a large left inguinal hernia without obstruction, incarceration or strangulation. Loops of ileum extend into a large right inguinal hernia also without obstruction, incarceration or strangulation. Bowel is normal in caliber. No wall thickening. No inflammation. Stomach is unremarkable. Vascular/Lymphatic: Dense aortic atherosclerosis. No aneurysm. No pathologically enlarged lymph nodes. Reproductive: Prostate enlarged measuring 6 x 4.7 x 5.3 cm. Other: No other abdominal wall hernias.  No ascites. Musculoskeletal: Compression fractures of L4 and L5, of unclear chronicity although these do appear chronic. No other fractures. No osteoblastic or osteolytic lesions. IMPRESSION: 1. No acute findings within the abdomen or pelvis. 2. Large bilateral inguinal hernias, the left containing colon and the right containing ileum. There is no bowel obstruction or evidence of hernia incarceration or bowel strangulation. 3. No bowel wall thickening or inflammation. 4. Bilateral pleural effusions with lung base atelectasis. 5. Dense aortic atherosclerosis. 6. Age indeterminate compression fractures of L4 on L5. Electronically Signed   By: Renard Hamper.D.  On: 10/23/2018 15:25    ____________________________________________   PROCEDURES  Procedure(s) performed:   Procedures  None ____________________________________________   INITIAL IMPRESSION / ASSESSMENT AND PLAN / ED COURSE  Pertinent labs & imaging results that were available during my care of the patient were reviewed by me and considered in my medical decision making (see chart for details).  Patient presents to the emergency department for evaluation of bright red blood per rectum for the past 2 days.  Symptoms are worsening.  Abdomen is soft and nontender.  I do not appreciate any external hemorrhoids or fissures on exam.  Given his  abdominal pain, constipation, GI bleeding plan for CT imaging for further evaluation along with labs and type and screen.  Vital signs are within normal limits.  Do not anticipate requiring emergent blood transfusion.  Hb is 13. CT with no acute findings. Patient is HDS. He does live alone and does not have reasonable follow up plan as an outpatient. Plan for obs admit for Hb trending. Creatinine up from recent values. Will start IVF and trend this as well.   Discussed patient's case with Hospitalist to request admission. Patient and family (if present) updated with plan. Care transferred to Hospitalist service.  I reviewed all nursing notes, vitals, pertinent old records, EKGs, labs, imaging (as available).  ____________________________________________  FINAL CLINICAL IMPRESSION(S) / ED DIAGNOSES  Final diagnoses:  Rectal bleeding  AKI (acute kidney injury) (HCC)     MEDICATIONS GIVEN DURING THIS VISIT:  Medications  lactated ringers infusion ( Intravenous Rate/Dose Verify 10/23/18 1734)  QUEtiapine (SEROQUEL) tablet 25 mg (has no administration in time range)  0.9 %  sodium chloride infusion ( Intravenous New Bag/Given 10/23/18 1828)  polyethylene glycol (MIRALAX / GLYCOLAX) packet 17 g (17 g Oral Not Given 10/23/18 2144)  bisacodyl (DULCOLAX) EC tablet 10 mg (10 mg Oral Given 10/23/18 1829)  senna-docusate (Senokot-S) tablet 3 tablet (3 tablets Oral Not Given 10/23/18 2144)     Note:  This document was prepared using Dragon voice recognition software and may include unintentional dictation errors.  Alona Bene, MD Emergency Medicine    Long, Arlyss Repress, MD 10/24/18 [phone]

## 2018-10-23 NOTE — H&P (Signed)
History and Physical    LONNELL CHAPUT ZOX:[id number] DOB: 1922-03-27 DOA: 10/23/2018  PCP: Patient, [name] Patient coming from: home  Chief Complaint: brbpr  HPI: Clayton Hoffman is a 84 y.o. male with medical history significant of only hypertension admitted with complaints of bright red bleeding per rectum started early this morning associated with constipation.  Patient is not sure if he has hemorrhoids.  He has never had a colonoscopy or EGD.  He had multiple episodes of bright red bleeding per rectum today which she was not able to stop it is so he decided to come to the ER.  He denies any nausea vomiting abdominal pain fever chills.  He denies chest pain shortness of breath cough.  He takes a baby aspirin at home.  Denies any urinary complaints or hematuria.  Denies melena.  Patient lives at home alone.  He reports his appetite is good and he has not had any weight loss.   ED Course: Patient was given IV fluids vital signs remained stable hemoglobin stable at 13.  CT of the abdomen pelvis showed no acute findings.  Large bilateral inguinal hernias.  No bowel wall thickening or inflammation.  Bilateral pleural effusions lung base atelectasis.  Age-indeterminate compression fracture L4-L5. Review of Systems: See HPI Ambulatory Status: Patient walks with a cane at home.  Past Medical History:  Diagnosis Date  . Hypertension     Past Surgical History:  Procedure Laterality Date  . LUNG BIOPSY      Social History   Socioeconomic History  . Marital status: Married    Spouse name: Not on file  . Number of children: Not on file  . Years of education: Not on file  . Highest education level: Not on file  Occupational History  . Not on file  Social Needs  . Financial resource strain: Not on file  . Food insecurity:    Worry: Not on file    Inability: Not on file  . Transportation needs:    Medical: Not on file    Non-medical: Not on file  Tobacco Use  . Smoking status:  Former Smoker    Years: 40.00    Types: Cigarettes  . Smokeless tobacco: Never Used  Substance and Sexual Activity  . Alcohol use: No  . Drug use: No  . Sexual activity: Not Currently  Lifestyle  . Physical activity:    Days per week: Not on file    Minutes per session: Not on file  . Stress: Not on file  Relationships  . Social connections:    Talks on phone: Not on file    Gets together: Not on file    Attends religious service: Not on file    Active member of club or organization: Not on file    Attends meetings of clubs or organizations: Not on file    Relationship status: Not on file  . Intimate partner violence:    Fear of current or ex partner: Not on file    Emotionally abused: Not on file    Physically abused: Not on file    Forced sexual activity: Not on file  Other Topics Concern  . 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    No  Known Allergies  No family history on file.    Prior to Admission medications   Medication Sig Start Date End Date Taking? Authorizing Provider  acetaminophen (TYLENOL 8 HOUR ARTHRITIS PAIN) 650 MG CR tablet Take 650 mg by mouth every 8 (eight) hours as needed for pain.   Yes [provider]  amLODipine (NORVASC) 10 MG tablet Take 20 mg by mouth daily. 09/27/18  Yes [provider]  aspirin EC 81 MG tablet Take 81 mg by mouth daily.   Yes [provider]  atenolol (TENORMIN) 50 MG tablet Take 50 mg by mouth daily.   Yes [provider]  benazepril (LOTENSIN) 10 MG tablet Take 10 mg by mouth daily. 09/27/18  Yes [provider]  hydrochlorothiazide (HYDRODIURIL) 12.5 MG tablet Take 12.5 mg by mouth daily. 09/27/18  Yes [provider]  QUEtiapine (SEROQUEL) 25  MG tablet Take 25 mg by mouth at bedtime. 09/27/18  Yes [provider]  amLODipine (NORVASC) 5 MG tablet Take 1 tablet (5 mg total) by mouth daily. Patient not taking: Reported on 10/23/2018 06/22/17   Regalado, Jon Billings A, MD  amoxicillin-clavulanate (AUGMENTIN) 500-125 MG tablet Take 1 tablet (500 mg total) by mouth every 12 (twelve) hours. Patient not taking: Reported on 10/23/2018 06/22/17   Regalado, Jon Billings A, MD  HYDROcodone-acetaminophen (NORCO/VICODIN) 5-325 MG tablet Take 0.5 tablets by mouth 2 (two) times daily as needed for moderate pain. Patient not taking: Reported on 10/23/2018 06/22/17   Alba Cory, MD    Physical Exam: Vitals:   10/23/18 1018 10/23/18 1241 10/23/18 1510  BP: (!) 125/50 139/60 134/66  Pulse: (!) 51 (!) 52 60  Resp: 18 17 17   Temp: 97.9 F (36.6 C)    TempSrc: Oral    SpO2: 100% 96% 92%     . General: Appears calm and comfortable . Eyes:  PERRL, EOMI, normal lids, iris . ENT: grossly normal hearing, lips & tongue, mmm . Neck:  no LAD, masses or thyromegaly . Cardiovascular: RRR, no m/r/g. No LE edema.  Marland Kitchen Respiratory: CTA bilaterally, no w/r/r. Normal respiratory effort. . Abdomen:  soft, ntnd, NABS . Skin:  no rash or induration seen on limited exam . Musculoskeletal: Trace bilateral lower extremity edema . Psychiatric:  grossly normal mood and affect, speech fluent and appropriate, AOx3 . Neurologic:  CN 2-12 grossly intact, moves all extremities in coordinated fashion, sensation intact  Labs on Admission: I have personally reviewed following labs and imaging studies  CBC: Recent Labs  Lab 10/23/18 1145  WBC 9.3  NEUTROABS 7.8*  HGB 13.0  HCT 40.8  MCV 99.8  PLT 163   Basic Metabolic Panel: Recent Labs  Lab 10/23/18 1145  NA 139  K 5.0  CL 114*  CO2 14*  GLUCOSE 118*  BUN 69*  CREATININE 2.40*  CALCIUM 9.1   GFR: CrCl cannot be calculated (Unknown ideal weight.). Liver Function Tests: Recent Labs  Lab  10/23/18 1145  AST 33  ALT 58*  ALKPHOS 70  BILITOT 1.1  PROT 7.2  ALBUMIN 4.1   No results for input(s): LIPASE, AMYLASE in the last 168 hours. No results for input(s): AMMONIA in the last 168 hours. Coagulation Profile: Recent Labs  Lab 10/23/18 1145  INR 1.16   Cardiac Enzymes: No results for input(s): CKTOTAL, CKMB, CKMBINDEX, TROPONINI in the last 168 hours. BNP (last 3 results) No results for input(s): PROBNP in the last 8760 hours. HbA1C: No results for input(s): HGBA1C in the last 72  hours. CBG: No results for input(s): GLUCAP in the last 168 hours. Lipid Profile: No results for input(s): CHOL, HDL, LDLCALC, TRIG, CHOLHDL, LDLDIRECT in the last 72 hours. Thyroid Function Tests: No results for input(s): TSH, T4TOTAL, FREET4, T3FREE, THYROIDAB in the last 72 hours. Anemia Panel: No results for input(s): VITAMINB12, FOLATE, FERRITIN, TIBC, IRON, RETICCTPCT in the last 72 hours. Urine analysis:    Component Value Date/Time   COLORURINE BROWN (A) 06/17/2017 1700   APPEARANCEUR TURBID (A) 06/17/2017 1700   LABSPEC 1.020 06/17/2017 1700   PHURINE 5.0 06/17/2017 1700   GLUCOSEU NEGATIVE 06/17/2017 1700   HGBUR LARGE (A) 06/17/2017 1700   BILIRUBINUR SMALL (A) 06/17/2017 1700   KETONESUR NEGATIVE 06/17/2017 1700   PROTEINUR 100 (A) 06/17/2017 1700   NITRITE NEGATIVE 06/17/2017 1700   LEUKOCYTESUR LARGE (A) 06/17/2017 1700    Creatinine Clearance: CrCl cannot be calculated (Unknown ideal weight.).  Sepsis Labs: @LABRCNTIP (procalcitonin:4,lacticidven:4) )No results found for this or any previous visit (from the past 240 hour(s)).   Radiological Exams on Admission: Ct Abdomen Pelvis Wo Contrast  Result Date: 10/23/2018 CLINICAL DATA:  Pt complains of bleeding from rectum since yesterday. Pt believes he is bleeding from hemorrhoids. EXAM: CT ABDOMEN AND PELVIS WITHOUT CONTRAST TECHNIQUE: Multidetector CT imaging of the abdomen and pelvis was performed following the  standard protocol without IV contrast. COMPARISON:  None. FINDINGS: Lower chest: Small right and small to moderate left pleural effusions. There is lung base opacity most consistent with atelectasis. Heart mildly enlarged. Hepatobiliary: No focal liver abnormality is seen. No gallstones, gallbladder wall thickening, or biliary dilatation. Pancreas: Unremarkable. No pancreatic ductal dilatation or surrounding inflammatory changes. Spleen: Spleen normal in size. Peripheral calcifications. No masses or focal lesions. Adrenals/Urinary Tract: No adrenal masses. Low-density exophytic mass, upper pole left kidney, 12 mm. Low-density renal sinus mass, midpole right kidney, 14 mm, both consistent with cysts. Renal cortical thinning. No collecting system stones. No hydronephrosis. Ureters normal in course and in caliber. Bladder is unremarkable. Stomach/Bowel: Portions of the sigmoid and left transverse colon extend into a large left inguinal hernia without obstruction, incarceration or strangulation. Loops of ileum extend into a large right inguinal hernia also without obstruction, incarceration or strangulation. Bowel is normal in caliber. No wall thickening. No inflammation. Stomach is unremarkable. Vascular/Lymphatic: Dense aortic atherosclerosis. No aneurysm. No pathologically enlarged lymph nodes. Reproductive: Prostate enlarged measuring 6 x 4.7 x 5.3 cm. Other: No other abdominal wall hernias.  No ascites. Musculoskeletal: Compression fractures of L4 and L5, of unclear chronicity although these do appear chronic. No other fractures. No osteoblastic or osteolytic lesions. IMPRESSION: 1. No acute findings within the abdomen or pelvis. 2. Large bilateral inguinal hernias, the left containing colon and the right containing ileum. There is no bowel obstruction or evidence of hernia incarceration or bowel strangulation. 3. No bowel wall thickening or inflammation. 4. Bilateral pleural effusions with lung base atelectasis.  5. Dense aortic atherosclerosis. 6. Age indeterminate compression fractures of L4 on L5. Electronically Signed   By: Amie Portland M.D.   On: 10/23/2018 15:25    EKG: Independently reviewed.  Assessment/Plan Active Problems:   * No active hospital problems. *   #1 lower GI bleed patient presenting with 1 day history of multiple episodes of bright red painless bleeding per rectum.  ED physician has put in a call to GI on call.  Patient is stable.  Vital signs are stable.  Hemoglobin is stable.  Has not had any further episodes while in the  ER.  Patient did not want a colonoscopy.  Patient also has associated constipation which initiated this bleeding.  He thinks he altered or also has may be internal hemorrhoids.  Will admit him hydrate him monitor him overnight repeat labs in the morning stool softeners will be given.  #2 AKI patient has no known history of CKD patient is also on hydrochlorothiazide and ACE inhibitor at home which contributed to AKI with decreased p.o. intake.  Hydrate and repeat labs.  #3 history of hypertension stable at this time he takes Norvasc ACE inhibitor and HCTZ at home.        Estimated body mass index is 21.55 kg/m as calculated from the following:   Height as of 06/17/17: 5\' 10"  (1.778 m).   Weight as of 06/22/17: 68.1 kg.   DVT prophylaxis: SCD Code Status: DO NOT RESUSCITATE Family Communication: Discussed with daughter who was in the room Disposition Plan: Pending clinical improvement discharged home Consults called: ED physician spoke with GI Admission status: Observation   Alwyn Ren MD [facility]  If 7PM-7AM, please contact night-coverage [email or web address] Password [facility]  10/23/2018, 4:36 PM

## 2018-10-23 NOTE — ED Notes (Signed)
ED TO INPATIENT HANDOFF REPORT  Name/Age/Gender Clayton Hoffman 84 y.o. male  Code Status    Code Status Orders  (From admission, onward)         Start     Ordered   10/23/18 1646  Do not attempt resuscitation (DNR)  Continuous    Question Answer Comment  In the event of cardiac or respiratory ARREST Do not call a "code blue"   In the event of cardiac or respiratory ARREST Do not perform Intubation, CPR, defibrillation or ACLS   In the event of cardiac or respiratory ARREST Use medication by any route, position, wound care, and other measures to relive pain and suffering. May use oxygen, suction and manual treatment of airway obstruction as needed for comfort.      10/23/18 1646        Code Status History    Date Active Date Inactive Code Status Order ID Comments User Context   06/17/2017 1954 06/22/2017 1753 DNR [id number]  Vianne Bulls, MD ED   06/17/2017 1837 06/17/2017 1953 DNR [id number]  Vianne Bulls, MD ED      Home/SNF/Other Home  Chief Complaint rectal bleeding  Level of Care/Admitting Diagnosis ED Disposition    ED Disposition Condition [facility] Area: [facility] [[id number]]  Level of Care: Med-Surg [16]  Diagnosis: LGI bleed [101751]  Admitting Physician: Georgette Shell [[id number]]  Attending Physician: Georgette Shell [[id number]]  PT Class (Do Not Modify): Observation [104]  PT Acc Code (Do Not Modify): Observation [10022]       Medical History Past Medical History:  Diagnosis Date  . Hypertension     Allergies No Known Allergies  IV Location/Drains/Wounds Patient Lines/Drains/Airways Status   Active Line/Drains/Airways    Name:   Placement date:   Placement time:   Site:   Days:   Peripheral IV 10/23/18 Left Antecubital   10/23/18    1151    Antecubital   less than 1          Labs/Imaging Results for orders placed or performed during the hospital encounter of 10/23/18 (from the past 48 hour(s))   Comprehensive metabolic panel     Status: Abnormal   Collection Time: 10/23/18 11:45 AM  Result Value Ref Range   Sodium 139 135 - 145 mmol/L   Potassium 5.0 3.5 - 5.1 mmol/L   Chloride 114 (H) 98 - 111 mmol/L   CO2 14 (L) 22 - 32 mmol/L   Glucose, Bld 118 (H) 70 - 99 mg/dL   BUN 69 (H) 8 - 23 mg/dL   Creatinine, Ser 2.40 (H) 0.61 - 1.24 mg/dL   Calcium 9.1 8.9 - 10.3 mg/dL   Total Protein 7.2 6.5 - 8.1 g/dL   Albumin 4.1 3.5 - 5.0 g/dL   AST 33 15 - 41 U/L   ALT 58 (H) 0 - 44 U/L   Alkaline Phosphatase 70 38 - 126 U/L   Total Bilirubin 1.1 0.3 - 1.2 mg/dL   GFR calc non Af Amer 22 (L) >60 mL/min   GFR calc Af Amer 25 (L) >60 mL/min    Comment: (NOTE) The eGFR has been calculated using the CKD EPI equation. This calculation has not been validated in all clinical situations. eGFR's persistently <60 mL/min signify possible Chronic Kidney Disease.    Anion gap 11 5 - 15    Comment: Performed at [facility], [place] [address] [place], [place] [address]  CBC with Differential     Status: Abnormal   Collection Time: 10/23/18 11:45 AM  Result Value Ref Range   WBC 9.3 4.0 - 10.5 K/uL   RBC 4.09 (L) 4.22 - 5.81 MIL/uL   Hemoglobin 13.0 13.0 - 17.0 g/dL   HCT 40.8 39.0 - 52.0 %   MCV 99.8 80.0 - 100.0 fL   MCH 31.8 26.0 - 34.0 pg   MCHC 31.9 30.0 - 36.0 g/dL   RDW 13.4 11.5 - 15.5 %   Platelets 163 150 - 400 K/uL   nRBC 0.0 0.0 - 0.2 %   Neutrophils Relative % 83 %   Neutro Abs 7.8 (H) 1.7 - 7.7 K/uL   Lymphocytes Relative 9 %   Lymphs Abs 0.8 0.7 - 4.0 K/uL   Monocytes Relative 6 %   Monocytes Absolute 0.5 0.1 - 1.0 K/uL   Eosinophils Relative 1 %   Eosinophils Absolute 0.1 0.0 - 0.5 K/uL   Basophils Relative 1 %   Basophils Absolute 0.1 0.0 - 0.1 K/uL   Immature Granulocytes 0 %   Abs Immature Granulocytes 0.03 0.00 - 0.07 K/uL    Comment: Performed at [facility], [place] [address], [place], [place] [address]  Protime-INR     Status:  None   Collection Time: 10/23/18 11:45 AM  Result Value Ref Range   Prothrombin Time 14.7 11.4 - 15.2 seconds   INR 1.16     Comment: Performed at [facility], [place] [address] [place], [place] [address]  Type and screen [place]     Status: None   Collection Time: 10/23/18 11:45 AM  Result Value Ref Range   ABO/RH(D) O POS    Antibody Screen NEG    Sample Expiration      10/26/2018 Performed at [facility], [place] [address], [place], [place] [address]    Ct Abdomen Pelvis Wo Contrast  Result Date: 10/23/2018 CLINICAL DATA:  Pt complains of bleeding from rectum since yesterday. Pt believes he is bleeding from hemorrhoids. EXAM: CT ABDOMEN AND PELVIS WITHOUT CONTRAST TECHNIQUE: Multidetector CT imaging of the abdomen and pelvis was performed following the standard protocol without IV contrast. COMPARISON:  None. FINDINGS: Lower chest: Small right and small to moderate left pleural effusions. There is lung base opacity most consistent with atelectasis. Heart mildly enlarged. Hepatobiliary: No focal liver abnormality is seen. No gallstones, gallbladder wall thickening, or biliary dilatation. Pancreas: Unremarkable. No pancreatic ductal dilatation or surrounding inflammatory changes. Spleen: Spleen normal in size. Peripheral calcifications. No masses or focal lesions. Adrenals/Urinary Tract: No adrenal masses. Low-density exophytic mass, upper pole left kidney, 12 mm. Low-density renal sinus mass, midpole right kidney, 14 mm, both consistent with cysts. Renal cortical thinning. No collecting system stones. No hydronephrosis. Ureters normal in course and in caliber. Bladder is unremarkable. Stomach/Bowel: Portions of the sigmoid and left transverse colon extend into a large left inguinal hernia without obstruction, incarceration or strangulation. Loops of ileum extend into a large right inguinal hernia also without obstruction, incarceration or  strangulation. Bowel is normal in caliber. No wall thickening. No inflammation. Stomach is unremarkable. Vascular/Lymphatic: Dense aortic atherosclerosis. No aneurysm. No pathologically enlarged lymph nodes. Reproductive: Prostate enlarged measuring 6 x 4.7 x 5.3 cm. Other: No other abdominal wall hernias.  No ascites. Musculoskeletal: Compression fractures of L4 and L5, of unclear chronicity although these do appear chronic. No other fractures. No osteoblastic or osteolytic lesions. IMPRESSION: 1. No acute findings within  the abdomen or pelvis. 2. Large bilateral inguinal hernias, the left containing colon and the right containing ileum. There is no bowel obstruction or evidence of hernia incarceration or bowel strangulation. 3. No bowel wall thickening or inflammation. 4. Bilateral pleural effusions with lung base atelectasis. 5. Dense aortic atherosclerosis. 6. Age indeterminate compression fractures of L4 on L5. Electronically Signed   By: Lajean Manes M.D.   On: 10/23/2018 15:25   None  Pending Labs Unresulted Labs (From admission, onward)    Start     Ordered   10/24/18 0500  CBC  Tomorrow morning,   R     10/23/18 1653   10/24/18 5573  Basic metabolic panel  Tomorrow morning,   R     10/23/18 1653   10/23/18 1145  ABO/Rh  Once,   R     10/23/18 1145          Vitals/Pain Today's Vitals   10/23/18 1020 10/23/18 1241 10/23/18 1510 10/23/18 1700  BP:  139/60 134/66 (!) 141/73  Pulse:  (!) 52 60 (!) 59  Resp:  17 17   Temp:      TempSrc:      SpO2:  96% 92% 94%  PainSc: 0-No pain       Isolation Precautions No active isolations  Medications Medications  lactated ringers infusion ( Intravenous Rate/Dose Verify 10/23/18 1734)  QUEtiapine (SEROQUEL) tablet 25 mg (has no administration in time range)  0.9 %  sodium chloride infusion (has no administration in time range)  polyethylene glycol (MIRALAX / GLYCOLAX) packet 17 g (has no administration in time range)  bisacodyl  (DULCOLAX) EC tablet 10 mg (has no administration in time range)  senna-docusate (Senokot-S) tablet 3 tablet (has no administration in time range)    Mobility walks with device

## 2018-10-24 DIAGNOSIS — I10 Essential (primary) hypertension: Secondary | ICD-10-CM

## 2018-10-24 DIAGNOSIS — N179 Acute kidney failure, unspecified: Secondary | ICD-10-CM | POA: Diagnosis not present

## 2018-10-24 DIAGNOSIS — K625 Hemorrhage of anus and rectum: Secondary | ICD-10-CM | POA: Diagnosis not present

## 2018-10-24 LAB — CBC
HCT: 38.9 % — ABNORMAL LOW (ref 39.0–52.0)
Hemoglobin: 12.4 g/dL — ABNORMAL LOW (ref 13.0–17.0)
MCH: 32 pg (ref 26.0–34.0)
MCHC: 31.9 g/dL (ref 30.0–36.0)
MCV: 100.5 fL — ABNORMAL HIGH (ref 80.0–100.0)
NRBC: 0 % (ref 0.0–0.2)
PLATELETS: 137 10*3/uL — AB (ref 150–400)
RBC: 3.87 MIL/uL — ABNORMAL LOW (ref 4.22–5.81)
RDW: 13.4 % (ref 11.5–15.5)
WBC: 8.3 10*3/uL (ref 4.0–10.5)

## 2018-10-24 LAB — BASIC METABOLIC PANEL
Anion gap: 8 (ref 5–15)
BUN: 53 mg/dL — ABNORMAL HIGH (ref 8–23)
CALCIUM: 8.6 mg/dL — AB (ref 8.9–10.3)
CO2: 14 mmol/L — AB (ref 22–32)
CREATININE: 1.8 mg/dL — AB (ref 0.61–1.24)
Chloride: 117 mmol/L — ABNORMAL HIGH (ref 98–111)
GFR, EST AFRICAN AMERICAN: 35 mL/min — AB (ref >60)
GFR, EST NON AFRICAN AMERICAN: 31 mL/min — AB (ref >60)
GLUCOSE: 85 mg/dL (ref 70–99)
Potassium: 4.8 mmol/L (ref 3.5–5.1)
Sodium: 139 mmol/L (ref 135–145)

## 2018-10-24 MED ORDER — SENNOSIDES-DOCUSATE SODIUM 8.6-50 MG PO TABS: 3.0000 | ORAL_TABLET | Freq: Two times a day (BID) | ORAL | Status: AC

## 2018-10-24 MED ORDER — AMLODIPINE BESYLATE 5 MG PO TABS: 5.0000 mg | ORAL_TABLET | Freq: Every day | ORAL | 11 refills | Status: DC

## 2018-10-24 MED ORDER — POLYETHYLENE GLYCOL 3350 17 G PO PACK: 17.0000 g | PACK | Freq: Two times a day (BID) | ORAL | 0 refills | Status: AC

## 2018-10-24 MED ORDER — BISACODYL 5 MG PO TBEC: 10.0000 mg | DELAYED_RELEASE_TABLET | Freq: Every day | ORAL | 0 refills | Status: AC

## 2018-10-24 NOTE — Evaluation (Signed)
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   Clinical Impression  Pt able to ambulate in hallway with cane and negotiate stairs.  He feels he is close to his baseline level. He verbalizes good safety awareness for home mobility, including taking his time and taking his cane with him.  He is not interested in therapy at home at this time.  Pt instructed to call his MD if he ever felt he needed it in the future.  Pt is scheduled to d/c this afternoon and will therefore d/c from PT.  If pt's status changes and dc is canceled, please re-order.    Follow Up Recommendations No PT follow up    Equipment Recommendations  None recommended by PT    Recommendations for Other Services       Precautions / Restrictions Precautions Precautions: Fall Restrictions Weight Bearing Restrictions: No      Mobility  Bed Mobility               General bed mobility comments: in recliner upon arrival  Transfers Overall transfer level: Needs assistance Equipment used: Straight cane Transfers: Sit to/from Stand Sit to Stand: Supervision         General transfer comment: Prior to standing, donned socks and shoes.  Needed some A with compression socks. Good use of hands with transfer  Ambulation/Gait Ambulation/Gait assistance: Min guard Gait Distance (Feet): 180 Feet Assistive device: Straight cane Gait Pattern/deviations: Step-through pattern;Decreased step length - right;Decreased step length - left     General Gait Details: reaching out for rail < 25% of the time.  Ambulated ~ 20 feet without cane or rail.  Stairs Stairs: Yes Stairs assistance: Min guard Stair Management:  One rail Left;Alternating pattern;Step to pattern Number of Stairs: 2 General stair comments: step to pattern with descent and step through with ascent  Wheelchair Mobility    Modified Rankin (Stroke Patients Only)       Balance Overall balance assessment: Needs assistance           Standing balance-Leahy Scale: Fair Standing balance comment: fair - dynamic and fair static                             Pertinent Vitals/Pain Pain Assessment: No/denies pain    Home Living Family/patient expects to be discharged to:: Private residence Living Arrangements: Alone Available Help at Discharge: Family;Available PRN/intermittently Type of Home: House Home Access: Stairs to enter Entrance Stairs-Rails: Right Entrance Stairs-Number of Steps: 3 Home Layout: One level Home Equipment: Walker - 2 wheels;Bedside commode;Cane - single point Additional Comments: walker and BSC were wife's.  He doesn't use.    Prior Function Level of Independence: Independent with assistive device(s)         Comments: Amb with cane     Hand Dominance        Extremity/Trunk Assessment   Upper Extremity Assessment Upper Extremity Assessment: Overall WFL for tasks assessed    Lower Extremity Assessment Lower Extremity Assessment: Overall WFL for tasks assessed;Generalized weakness       Communication   Communication: HOH  Cognition Arousal/Alertness: Awake/alert Behavior During Therapy: WFL for tasks assessed/performed Overall Cognitive Status: Within  Functional Limits for tasks assessed                                        General Comments      Exercises     Assessment/Plan    PT Assessment Patent does not need any further PT services  PT Problem List         PT Treatment Interventions      PT Goals (Current goals can be found in the Care Plan section)  Acute Rehab PT Goals Patient Stated Goal: home PT Goal Formulation: All assessment and  education complete, DC therapy    Frequency     Barriers to discharge        Co-evaluation               AM-PAC PT "6 Clicks" Daily Activity  Outcome Measure Difficulty turning over in bed (including adjusting bedclothes, sheets and blankets)?: A Little Difficulty moving from lying on back to sitting on the side of the bed? : A Little Difficulty sitting down on and standing up from a chair with arms (e.g., wheelchair, bedside commode, etc,.)?: None Help needed moving to and from a bed to chair (including a wheelchair)?: None Help needed walking in hospital room?: A Little Help needed climbing 3-5 steps with a railing? : A Little 6 Click Score: 20    End of Session Equipment Utilized During Treatment: Gait belt Activity Tolerance: Patient tolerated treatment well Patient left: in chair;with chair alarm set Nurse Communication: Mobility status PT Visit Diagnosis: Unsteadiness on feet (R26.81);Difficulty in walking, not elsewhere classified (R26.2)    Time: 1610-9604 PT Time Calculation (min) (ACUTE ONLY): 34 min   Charges:   PT Evaluation $PT Eval Low Complexity: 1 Low PT Treatments $Gait Training: 8-22 mins        Burnette Sautter L. Katrinka Blazing, [place] Pager [phone] 10/24/2018   Enzo Montgomery 10/24/2018, 1:05 PM

## 2018-10-24 NOTE — Progress Notes (Signed)
Patient discharged to home, all discharge medications and instructions reviewed.

## 2018-10-24 NOTE — Discharge Summary (Addendum)
Physician Discharge Summary  Clayton Hoffman VWU:[id number] DOB: 1921-12-23 DOA: 10/23/2018  PCP: Patient, [name]  Admit date: 10/23/2018 Discharge date: 10/24/2018  Admitted From: Home Disposition: Home   Recommendations for Outpatient Follow-up:  1. Follow up with PCP in 1-2 weeks/patient has a PCP that comes to his home to see him like doctors making house calls he did not know the name of the doctor. 2. Please obtain BMP/CBC in one week  Home Health: none Equipment/Devices none Discharge Condition stable CODE STATUS: DO NOT RESUSCITATE Diet recommendation cardiac  Brief/Interim Summary:84 y.o. male with medical history significant of only hypertension admitted with complaints of bright red bleeding per rectum started early this morning associated with constipation.  Patient is not sure if he has hemorrhoids.  He has never had a colonoscopy or EGD.  He had multiple episodes of bright red bleeding per rectum today which she was not able to stop it is so he decided to come to the ER.  He denies any nausea vomiting abdominal pain fever chills.  He denies chest pain shortness of breath cough.  He takes a baby aspirin at home.  Denies any urinary complaints or hematuria.  Denies melena.  Patient lives at home alone.  He reports his appetite is good and he has not had any weight loss.   ED Course: Patient was given IV fluids vital signs remained stable hemoglobin stable at 13.  CT of the abdomen pelvis showed no acute findings.  Large bilateral inguinal hernias.  No bowel wall thickening or inflammation.  Bilateral pleural effusions lung base atelectasis.  Age-indeterminate compression fracture L4-L5.   Discharge Diagnoses:  Active Problems:   LGI bleed   HTN (hypertension)   1 lower GI bleed patient presenting with 1 day history of multiple episodes of bright red painless bleeding per rectum.  ED physician discussed with GI on call and recommended to admit him for observation and if  he has no active bleeding continue conservative management.  Patient is stable.  Vital signs are stable.  Hemoglobin is stable.  Had one episode of fresh bleeding this morning with bowel movements which was spontaneously stopped on its own.  Patient does not  want any GI work-up done.  Patient is also extremely constipated for many days prior to admission to the hospital.  He will be discharged home on stool softeners .  Constipation has caused him to have hemorrhoidal bleeding.  #2 AKI resolved with IV fluids  #3 history of hypertension restart Norvasc metoprolol and HCTZ 12.5 daily I will continue to hold ACE inhibitor since his blood pressure is not high enough to restart all the 4 medications.     Estimated body mass index is 21.55 kg/m as calculated from the following:   Height as of 06/17/17: 5\' 10"  (1.778 m).   Weight as of 06/22/17: 68.1 kg.  Discharge Instructions  Discharge Instructions    Call MD for:  difficulty breathing, headache or visual disturbances   Complete by:  As directed    Call MD for:  persistant nausea and vomiting   Complete by:  As directed    Call MD for:  severe uncontrolled pain   Complete by:  As directed    Diet - low sodium heart healthy   Complete by:  As directed    Increase activity slowly   Complete by:  As directed      Allergies as of 10/24/2018   No Known Allergies  Medication List    STOP taking these medications   amoxicillin-clavulanate 500-125 MG tablet Commonly known as:  AUGMENTIN   aspirin EC 81 MG tablet   benazepril 10 MG tablet Commonly known as:  LOTENSIN   HYDROcodone-acetaminophen 5-325 MG tablet Commonly known as:  NORCO/VICODIN   TYLENOL 8 HOUR ARTHRITIS PAIN 650 MG CR tablet Generic drug:  acetaminophen     TAKE these medications   amLODipine 5 MG tablet Commonly known as:  NORVASC Take 1 tablet (5 mg total) by mouth daily. What changed:  Another medication with the same name was removed. Continue taking  this medication, and follow the directions you see here.   atenolol 50 MG tablet Commonly known as:  TENORMIN Take 50 mg by mouth daily.   bisacodyl 5 MG EC tablet Commonly known as:  DULCOLAX Take 2 tablets (10 mg total) by mouth daily. Start taking on:  10/25/2018   hydrochlorothiazide 12.5 MG tablet Commonly known as:  HYDRODIURIL Take 12.5 mg by mouth daily.   polyethylene glycol packet Commonly known as:  MIRALAX / GLYCOLAX Take 17 g by mouth 2 (two) times daily.   QUEtiapine 25 MG tablet Commonly known as:  SEROQUEL Take 25 mg by mouth at bedtime.   senna-docusate 8.6-50 MG tablet Commonly known as:  Senokot-S Take 3 tablets by mouth 2 (two) times daily.       No Known Allergies  Consultations:  ED physician discussed with GI on-call    Procedures/Studies: Ct Abdomen Pelvis Wo Contrast  Result Date: 10/23/2018 CLINICAL DATA:  Pt complains of bleeding from rectum since yesterday. Pt believes he is bleeding from hemorrhoids. EXAM: CT ABDOMEN AND PELVIS WITHOUT CONTRAST TECHNIQUE: Multidetector CT imaging of the abdomen and pelvis was performed following the standard protocol without IV contrast. COMPARISON:  None. FINDINGS: Lower chest: Small right and small to moderate left pleural effusions. There is lung base opacity most consistent with atelectasis. Heart mildly enlarged. Hepatobiliary: No focal liver abnormality is seen. No gallstones, gallbladder wall thickening, or biliary dilatation. Pancreas: Unremarkable. No pancreatic ductal dilatation or surrounding inflammatory changes. Spleen: Spleen normal in size. Peripheral calcifications. No masses or focal lesions. Adrenals/Urinary Tract: No adrenal masses. Low-density exophytic mass, upper pole left kidney, 12 mm. Low-density renal sinus mass, midpole right kidney, 14 mm, both consistent with cysts. Renal cortical thinning. No collecting system stones. No hydronephrosis. Ureters normal in course and in caliber. Bladder  is unremarkable. Stomach/Bowel: Portions of the sigmoid and left transverse colon extend into a large left inguinal hernia without obstruction, incarceration or strangulation. Loops of ileum extend into a large right inguinal hernia also without obstruction, incarceration or strangulation. Bowel is normal in caliber. No wall thickening. No inflammation. Stomach is unremarkable. Vascular/Lymphatic: Dense aortic atherosclerosis. No aneurysm. No pathologically enlarged lymph nodes. Reproductive: Prostate enlarged measuring 6 x 4.7 x 5.3 cm. Other: No other abdominal wall hernias.  No ascites. Musculoskeletal: Compression fractures of L4 and L5, of unclear chronicity although these do appear chronic. No other fractures. No osteoblastic or osteolytic lesions. IMPRESSION: 1. No acute findings within the abdomen or pelvis. 2. Large bilateral inguinal hernias, the left containing colon and the right containing ileum. There is no bowel obstruction or evidence of hernia incarceration or bowel strangulation. 3. No bowel wall thickening or inflammation. 4. Bilateral pleural effusions with lung base atelectasis. 5. Dense aortic atherosclerosis. 6. Age indeterminate compression fractures of L4 on L5. Electronically Signed   By: Amie Portland M.D.   On: 10/23/2018 15:25    (  Echo, Carotid, EGD, Colonoscopy, ERCP)    Subjective:   Discharge Exam: Vitals:   10/23/18 2106 10/24/18 0530  BP: (!) 131/46 (!) 147/66  Pulse: (!) 52 (!) 106  Resp: 18 20  Temp: 98.1 F (36.7 C) 98.4 F (36.9 C)  SpO2: 97% 95%   Vitals:   10/23/18 1700 10/23/18 1807 10/23/18 2106 10/24/18 0530  BP: (!) 141/73 (!) 157/60 (!) 131/46 (!) 147/66  Pulse: (!) 59 62 (!) 52 (!) 106  Resp:  18 18 20   Temp:  97.9 F (36.6 C) 98.1 F (36.7 C) 98.4 F (36.9 C)  TempSrc:  Oral Oral Oral  SpO2: 94% 95% 97% 95%    General: Pt is alert, awake, not in acute distress Cardiovascular: RRR, S1/S2 +, no rubs, no gallops Respiratory: CTA  bilaterally, no wheezing, no rhonchi Abdominal: Soft, NT, ND, bowel sounds + Extremities: trace edema    The results of significant diagnostics from this hospitalization (including imaging, microbiology, ancillary and laboratory) are listed below for reference.     Microbiology: No results found for this or any previous visit (from the past 240 hour(s)).   Labs: BNP (last 3 results) No results for input(s): BNP in the last 8760 hours. Basic Metabolic Panel: Recent Labs  Lab 10/23/18 1145 10/24/18 0458  NA 139 139  K 5.0 4.8  CL 114* 117*  CO2 14* 14*  GLUCOSE 118* 85  BUN 69* 53*  CREATININE 2.40* 1.80*  CALCIUM 9.1 8.6*   Liver Function Tests: Recent Labs  Lab 10/23/18 1145  AST 33  ALT 58*  ALKPHOS 70  BILITOT 1.1  PROT 7.2  ALBUMIN 4.1   No results for input(s): LIPASE, AMYLASE in the last 168 hours. No results for input(s): AMMONIA in the last 168 hours. CBC: Recent Labs  Lab 10/23/18 1145 10/24/18 0458  WBC 9.3 8.3  NEUTROABS 7.8*  --   HGB 13.0 12.4*  HCT 40.8 38.9*  MCV 99.8 100.5*  PLT 163 137*   Cardiac Enzymes: No results for input(s): CKTOTAL, CKMB, CKMBINDEX, TROPONINI in the last 168 hours. BNP: Invalid input(s): POCBNP CBG: No results for input(s): GLUCAP in the last 168 hours. D-Dimer No results for input(s): DDIMER in the last 72 hours. Hgb A1c No results for input(s): HGBA1C in the last 72 hours. Lipid Profile No results for input(s): CHOL, HDL, LDLCALC, TRIG, CHOLHDL, LDLDIRECT in the last 72 hours. Thyroid function studies No results for input(s): TSH, T4TOTAL, T3FREE, THYROIDAB in the last 72 hours.  Invalid input(s): FREET3 Anemia work up No results for input(s): VITAMINB12, FOLATE, FERRITIN, TIBC, IRON, RETICCTPCT in the last 72 hours. Urinalysis    Component Value Date/Time   COLORURINE BROWN (A) 06/17/2017 1700   APPEARANCEUR TURBID (A) 06/17/2017 1700   LABSPEC 1.020 06/17/2017 1700   PHURINE 5.0 06/17/2017 1700    GLUCOSEU NEGATIVE 06/17/2017 1700   HGBUR LARGE (A) 06/17/2017 1700   BILIRUBINUR SMALL (A) 06/17/2017 1700   KETONESUR NEGATIVE 06/17/2017 1700   PROTEINUR 100 (A) 06/17/2017 1700   NITRITE NEGATIVE 06/17/2017 1700   LEUKOCYTESUR LARGE (A) 06/17/2017 1700   Sepsis Labs Invalid input(s): PROCALCITONIN,  WBC,  LACTICIDVEN Microbiology No results found for this or any previous visit (from the past 240 hour(s)).   Time coordinating discharge: 33 minutes  SIGNED:   Alwyn Ren, MD  [facility] 10/24/2018, 11:39 AM Pager   If 7PM-7AM, please contact night-coverage [email or web address] Password [email or web address]

## 2019-12-04 ENCOUNTER — Encounter (HOSPITAL_COMMUNITY): Payer: Self-pay | Admitting: Emergency Medicine

## 2019-12-04 ENCOUNTER — Emergency Department (HOSPITAL_COMMUNITY): Payer: Medicare Other

## 2019-12-04 ENCOUNTER — Inpatient Hospital Stay (HOSPITAL_COMMUNITY)
Admission: EM | Admit: 2019-12-04 | Discharge: 2019-12-08 | DRG: 309 | Disposition: A | Discharge status: Home / Self Care | Payer: Medicare Other | Attending: Internal Medicine | Admitting: Internal Medicine

## 2019-12-04 DIAGNOSIS — Z20828 Contact with and (suspected) exposure to other viral communicable diseases: Secondary | ICD-10-CM | POA: Diagnosis present

## 2019-12-04 DIAGNOSIS — I4891 Unspecified atrial fibrillation: Principal | ICD-10-CM | POA: Diagnosis present

## 2019-12-04 DIAGNOSIS — N1832 Chronic kidney disease, stage 3b: Secondary | ICD-10-CM | POA: Diagnosis present

## 2019-12-04 DIAGNOSIS — Z87891 Personal history of nicotine dependence: Secondary | ICD-10-CM

## 2019-12-04 DIAGNOSIS — I509 Heart failure, unspecified: Secondary | ICD-10-CM

## 2019-12-04 DIAGNOSIS — Z8719 Personal history of other diseases of the digestive system: Secondary | ICD-10-CM

## 2019-12-04 DIAGNOSIS — N189 Chronic kidney disease, unspecified: Secondary | ICD-10-CM | POA: Diagnosis not present

## 2019-12-04 DIAGNOSIS — Z8619 Personal history of other infectious and parasitic diseases: Secondary | ICD-10-CM

## 2019-12-04 DIAGNOSIS — Z66 Do not resuscitate: Secondary | ICD-10-CM | POA: Diagnosis present

## 2019-12-04 DIAGNOSIS — R072 Precordial pain: Secondary | ICD-10-CM

## 2019-12-04 DIAGNOSIS — Z79899 Other long term (current) drug therapy: Secondary | ICD-10-CM

## 2019-12-04 DIAGNOSIS — I5023 Acute on chronic systolic (congestive) heart failure: Secondary | ICD-10-CM

## 2019-12-04 DIAGNOSIS — I5030 Unspecified diastolic (congestive) heart failure: Secondary | ICD-10-CM

## 2019-12-04 DIAGNOSIS — I5022 Chronic systolic (congestive) heart failure: Secondary | ICD-10-CM | POA: Diagnosis present

## 2019-12-04 DIAGNOSIS — I130 Hypertensive heart and chronic kidney disease with heart failure and stage 1 through stage 4 chronic kidney disease, or unspecified chronic kidney disease: Secondary | ICD-10-CM | POA: Diagnosis present

## 2019-12-04 DIAGNOSIS — N183 Chronic kidney disease, stage 3 unspecified: Secondary | ICD-10-CM

## 2019-12-04 DIAGNOSIS — Z8249 Family history of ischemic heart disease and other diseases of the circulatory system: Secondary | ICD-10-CM

## 2019-12-04 DIAGNOSIS — H9190 Unspecified hearing loss, unspecified ear: Secondary | ICD-10-CM

## 2019-12-04 DIAGNOSIS — R109 Unspecified abdominal pain: Secondary | ICD-10-CM

## 2019-12-04 HISTORY — DX: Unspecified atrial fibrillation: I48.91

## 2019-12-04 HISTORY — DX: Unspecified hearing loss, unspecified ear: H91.90

## 2019-12-04 LAB — BASIC METABOLIC PANEL
Anion gap: 11 (ref 5–15)
BUN: 37 mg/dL — ABNORMAL HIGH (ref 8–23)
CO2: 18 mmol/L — ABNORMAL LOW (ref 22–32)
Calcium: 8.9 mg/dL (ref 8.9–10.3)
Chloride: 106 mmol/L (ref 98–111)
Creatinine, Ser: 2.1 mg/dL — ABNORMAL HIGH (ref 0.61–1.24)
GFR calc Af Amer: 30 mL/min — ABNORMAL LOW (ref >60)
GFR calc non Af Amer: 26 mL/min — ABNORMAL LOW (ref >60)
Glucose, Bld: 106 mg/dL — ABNORMAL HIGH (ref 70–99)
Potassium: 4.7 mmol/L (ref 3.5–5.1)
Sodium: 135 mmol/L (ref 135–145)

## 2019-12-04 LAB — URINALYSIS, ROUTINE W REFLEX MICROSCOPIC
Bacteria, UA: NONE SEEN
Bilirubin Urine: NEGATIVE
Glucose, UA: NEGATIVE mg/dL
Hgb urine dipstick: NEGATIVE
Ketones, ur: NEGATIVE mg/dL
Leukocytes,Ua: NEGATIVE
Nitrite: NEGATIVE
Protein, ur: 100 mg/dL — AB
Specific Gravity, Urine: 1.012 (ref 1.005–1.030)
pH: 5 (ref 5.0–8.0)

## 2019-12-04 LAB — CBC WITH DIFFERENTIAL/PLATELET
Abs Immature Granulocytes: 0.03 10*3/uL (ref 0.00–0.07)
Basophils Absolute: 0.1 10*3/uL (ref 0.0–0.1)
Basophils Relative: 1 %
Eosinophils Absolute: 0.1 10*3/uL (ref 0.0–0.5)
Eosinophils Relative: 1 %
HCT: 44.7 % (ref 39.0–52.0)
Hemoglobin: 14.9 g/dL (ref 13.0–17.0)
Immature Granulocytes: 0 %
Lymphocytes Relative: 14 %
Lymphs Abs: 1 10*3/uL (ref 0.7–4.0)
MCH: 31.7 pg (ref 26.0–34.0)
MCHC: 33.3 g/dL (ref 30.0–36.0)
MCV: 95.1 fL (ref 80.0–100.0)
Monocytes Absolute: 0.5 10*3/uL (ref 0.1–1.0)
Monocytes Relative: 6 %
Neutro Abs: 6 10*3/uL (ref 1.7–7.7)
Neutrophils Relative %: 78 %
Platelets: 162 10*3/uL (ref 150–400)
RBC: 4.7 MIL/uL (ref 4.22–5.81)
RDW: 14.7 % (ref 11.5–15.5)
WBC: 7.6 10*3/uL (ref 4.0–10.5)
nRBC: 0 % (ref 0.0–0.2)

## 2019-12-04 LAB — TROPONIN I (HIGH SENSITIVITY)
Troponin I (High Sensitivity): 28 ng/L — ABNORMAL HIGH (ref <18)
Troponin I (High Sensitivity): 55 ng/L — ABNORMAL HIGH (ref <18)

## 2019-12-04 LAB — SARS CORONAVIRUS 2 (TAT 6-24 HRS): SARS Coronavirus 2: NEGATIVE

## 2019-12-04 LAB — BRAIN NATRIURETIC PEPTIDE: B Natriuretic Peptide: 1568.1 pg/mL — ABNORMAL HIGH (ref 0.0–100.0)

## 2019-12-04 LAB — TSH: TSH: 3.837 u[IU]/mL (ref 0.350–4.500)

## 2019-12-04 MED ORDER — SENNOSIDES-DOCUSATE SODIUM 8.6-50 MG PO TABS
3.0000 | ORAL_TABLET | Freq: Two times a day (BID) | ORAL | Status: DC
  Administered 2019-12-05 – 2019-12-08 (×4): 3 via ORAL
  Filled 2019-12-04 (×6): qty 3

## 2019-12-04 MED ORDER — ACETAMINOPHEN 325 MG PO TABS
650.0000 mg | ORAL_TABLET | Freq: Four times a day (QID) | ORAL | Status: DC | PRN
  Administered 2019-12-05 – 2019-12-07 (×6): 650 mg via ORAL
  Filled 2019-12-04 (×7): qty 2

## 2019-12-04 MED ORDER — METOPROLOL TARTRATE 5 MG/5ML IV SOLN
2.5000 mg | Freq: Four times a day (QID) | INTRAVENOUS | Status: DC | PRN
  Administered 2019-12-04 – 2019-12-05 (×2): 2.5 mg via INTRAVENOUS
  Filled 2019-12-04 (×2): qty 5

## 2019-12-04 MED ORDER — METOPROLOL TARTRATE 25 MG PO TABS
25.0000 mg | ORAL_TABLET | Freq: Two times a day (BID) | ORAL | Status: DC
  Administered 2019-12-04 – 2019-12-05 (×2): 25 mg via ORAL
  Filled 2019-12-04 (×3): qty 1

## 2019-12-04 MED ORDER — BISACODYL 5 MG PO TBEC
10.0000 mg | DELAYED_RELEASE_TABLET | Freq: Every day | ORAL | Status: DC
  Administered 2019-12-05 – 2019-12-08 (×2): 10 mg via ORAL
  Filled 2019-12-04 (×3): qty 2

## 2019-12-04 MED ORDER — AMLODIPINE BESYLATE 5 MG PO TABS: 5.0000 mg | ORAL_TABLET | Freq: Every day | ORAL | Status: DC

## 2019-12-04 MED ORDER — QUETIAPINE FUMARATE 25 MG PO TABS
25.0000 mg | ORAL_TABLET | Freq: Every day | ORAL | Status: DC
  Administered 2019-12-05 – 2019-12-07 (×4): 25 mg via ORAL
  Filled 2019-12-04 (×5): qty 1

## 2019-12-04 MED ORDER — FUROSEMIDE 10 MG/ML IJ SOLN
20.0000 mg | Freq: Once | INTRAMUSCULAR | Status: AC
  Administered 2019-12-04: 20 mg via INTRAVENOUS
  Filled 2019-12-04: qty 2

## 2019-12-04 MED ORDER — APIXABAN 2.5 MG PO TABS
2.5000 mg | ORAL_TABLET | Freq: Two times a day (BID) | ORAL | Status: DC
  Administered 2019-12-05 – 2019-12-08 (×7): 2.5 mg via ORAL
  Filled 2019-12-04 (×9): qty 1

## 2019-12-04 MED ORDER — LACTULOSE 10 GM/15ML PO SOLN
10.0000 g | Freq: Three times a day (TID) | ORAL | Status: AC
  Administered 2019-12-05: 10 g via ORAL
  Filled 2019-12-04 (×3): qty 15

## 2019-12-04 MED ORDER — POLYETHYLENE GLYCOL 3350 17 G PO PACK
17.0000 g | PACK | Freq: Two times a day (BID) | ORAL | Status: DC
  Administered 2019-12-05 – 2019-12-08 (×4): 17 g via ORAL
  Filled 2019-12-04 (×5): qty 1

## 2019-12-04 NOTE — ED Notes (Signed)
Dinner Tray Ordered @ 1842. 

## 2019-12-04 NOTE — ED Provider Notes (Signed)
4:13 PM signout from [facility] at shift change.  Patient presents with progressively worsening shortness of breath, intermittent episodes of left-sided chest pains, and "giving out" with activity over the past 2 weeks.  Patient does not have a history of heart failure or any other heart problems.  Denies any infectious type symptoms.  Work-up demonstrates crackles in the patient's lower lung fields with signs of vascular congestion on chest x-ray.  He has a mildly elevated troponin.  No significant lower extremity swelling.  Symptoms are concerning for congestive heart failure.  Discussed results with patient who agrees to be admitted for further evaluation.  I have ordered 20 mg of Lasix.  I attempted to call the patient's daughter whose phone number is listed in epic without any answer.  Will discuss with hospitalist.   BP 138/88   Pulse 100   Resp (!) 25   SpO2 99%   4:27 PM Spoke with Dr. Roosevelt Locks who will see.    Carlisle Cater, PA-C 12/04/19 1628    Blanchie Dessert, MD 12/04/19 [phone]

## 2019-12-04 NOTE — H&P (Signed)
History and Physical    Clayton Hoffman JXB:[id number] DOB: 1921/12/27 DOA: 12/04/2019  PCP: Patient, [name]   Patient coming from: Home  I have personally briefly reviewed patient's old medical records in [facility]  Chief Complaint: Chest pain  HPI: Clayton Hoffman is a 85 y.o. male with medical history significant of HTN, Hemorrhoid, CKD stage III, presented with no chest pains.  Symptoms started about 1 week ago, happened when patient was having constipation and was trying to straining on toilet bowl, feature of the chest pain is sharp like, more toward the left side of the chest radiated to the left shoulder, associated with short of breath. Then on, he kept having constipation episodes and had to strain each time, and the chest pain happened  On most occasions when he was trying to strain/push down, to a point, he stopped straining.  His grandson Clayton Hoffman, brought him to PCP, suspect patient has A. Fib, and added potassium and magnesium, but no change of his BP meds, does not sound there was a discussion regarding anticoagulation either between patient's family and PCP.  Patient was on call EMS this morning, for persistent chest discomfort.  Patient denied any nauseous vomiting, no cough no fever chills, no palpitations no sweating during any of those episodes. ED Course: Was found to have rapid A. fib and mild elevation of troponin.  Review of Systems: As per HPI otherwise 10 point review of systems negative.  X-ray showed congestion received 1 dose of Lasix, CKD remained stable.   Past Medical History:  Diagnosis Date  . Hypertension     Past Surgical History:  Procedure Laterality Date  . LUNG BIOPSY       reports that he has quit smoking. His smoking use included cigarettes. He quit after 40.00 years of use. He has never used smokeless tobacco. He reports that he does not drink alcohol or use drugs.  No Known Allergies  FH, hypertension run in family.   Prior to  Admission medications   Medication Sig Start Date End Date Taking? Authorizing Provider  atenolol (TENORMIN) 50 MG tablet Take 50 mg by mouth daily.   Yes [provider]  bisacodyl (DULCOLAX) 5 MG EC tablet Take 2 tablets (10 mg total) by mouth daily. 10/25/18  Yes Alwyn Ren, MD  polyethylene glycol [facility] / Ethelene Hal) packet Take 17 g by mouth 2 (two) times daily. 10/24/18  Yes Alwyn Ren, MD  QUEtiapine (SEROQUEL) 25 MG tablet Take 25 mg by mouth at bedtime. 09/27/18  Yes [provider]  senna-docusate (SENOKOT-S) 8.6-50 MG tablet Take 3 tablets by mouth 2 (two) times daily. 10/24/18  Yes Alwyn Ren, MD  amLODipine (NORVASC) 5 MG tablet Take 1 tablet (5 mg total) by mouth daily. 10/24/18 10/24/19  Alwyn Ren, MD  hydrochlorothiazide (HYDRODIURIL) 12.5 MG tablet Take 12.5 mg by mouth daily. 09/27/18   [provider]    Physical Exam: Vitals:   12/04/19 1615 12/04/19 1630 12/04/19 1645 12/04/19 1700  BP: (!) 140/98 (!) 142/97 137/84 131/87  Pulse: (!) 114  (!) 120 80  Resp: 15 (!) 21 19 (!) 24  SpO2: 97%  100% 100%    Constitutional: NAD, calm, comfortable Vitals:   12/04/19 1615 12/04/19 1630 12/04/19 1645 12/04/19 1700  BP: (!) 140/98 (!) 142/97 137/84 131/87  Pulse: (!) 114  (!) 120 80  Resp: 15 (!) 21 19 (!) 24  SpO2: 97%  100% 100%  Eyes: PERRL, lids and conjunctivae normal ENMT: Mucous membranes are moist. Posterior pharynx clear of any exudate or lesions.Normal dentition.  Neck: normal, supple, no masses, no thyromegaly Respiratory: clear to auscultation bilaterally, no wheezing, no crackles. Normal respiratory effort. No accessory muscle use.  Cardiovascular: Irregular heart rate, soft murmur on heart base. No extremity edema. 2+ pedal pulses. No carotid bruits.  Abdomen: no tenderness, no masses palpated. No hepatosplenomegaly. Bowel sounds positive.  Musculoskeletal: no clubbing / cyanosis. No joint  deformity upper and lower extremities. Good ROM, no contractures. Normal muscle tone.  Skin: no rashes, lesions, ulcers. No induration Neurologic: CN 2-12 grossly intact. Sensation intact, DTR normal. Strength 5/5 in all 4.  Psychiatric: Normal judgment and insight. Alert and oriented x 3. Normal mood.     Labs on Admission: I have personally reviewed following labs and imaging studies  CBC: Recent Labs  Lab 12/04/19 1444  WBC 7.6  NEUTROABS 6.0  HGB 14.9  HCT 44.7  MCV 95.1  PLT 162   Basic Metabolic Panel: Recent Labs  Lab 12/04/19 1444  NA 135  K 4.7  CL 106  CO2 18*  GLUCOSE 106*  BUN 37*  CREATININE 2.10*  CALCIUM 8.9   GFR: CrCl cannot be calculated (Unknown ideal weight.). Liver Function Tests: No results for input(s): AST, ALT, ALKPHOS, BILITOT, PROT, ALBUMIN in the last 168 hours. No results for input(s): LIPASE, AMYLASE in the last 168 hours. No results for input(s): AMMONIA in the last 168 hours. Coagulation Profile: No results for input(s): INR, PROTIME in the last 168 hours. Cardiac Enzymes: No results for input(s): CKTOTAL, CKMB, CKMBINDEX, TROPONINI in the last 168 hours. BNP (last 3 results) No results for input(s): PROBNP in the last 8760 hours. HbA1C: No results for input(s): HGBA1C in the last 72 hours. CBG: No results for input(s): GLUCAP in the last 168 hours. Lipid Profile: No results for input(s): CHOL, HDL, LDLCALC, TRIG, CHOLHDL, LDLDIRECT in the last 72 hours. Thyroid Function Tests: No results for input(s): TSH, T4TOTAL, FREET4, T3FREE, THYROIDAB in the last 72 hours. Anemia Panel: No results for input(s): VITAMINB12, FOLATE, FERRITIN, TIBC, IRON, RETICCTPCT in the last 72 hours. Urine analysis:    Component Value Date/Time   COLORURINE BROWN (A) 06/17/2017 1700   APPEARANCEUR TURBID (A) 06/17/2017 1700   LABSPEC 1.020 06/17/2017 1700   PHURINE 5.0 06/17/2017 1700   GLUCOSEU NEGATIVE 06/17/2017 1700   HGBUR LARGE (A) 06/17/2017  1700   BILIRUBINUR SMALL (A) 06/17/2017 1700   KETONESUR NEGATIVE 06/17/2017 1700   PROTEINUR 100 (A) 06/17/2017 1700   NITRITE NEGATIVE 06/17/2017 1700   LEUKOCYTESUR LARGE (A) 06/17/2017 1700    Radiological Exams on Admission: DG Chest Port 1 View  Result Date: 12/04/2019 CLINICAL DATA:  Chest pain with exertional shortness of breath and difficulty urinating for 2 weeks. History of atrial fibrillation. EXAM: PORTABLE CHEST 1 VIEW COMPARISON:  Chest radiographs 06/17/2017. Abdominopelvic CT 10/23/2018. FINDINGS: 1429 hours. Two views obtained. The heart size and mediastinal contours are stable with aortic atherosclerosis. Left-greater-than-right biapical scarring appears stable. The pleural effusions demonstrated on prior abdominal CT have improved, although there is a possible small residual/recurrent left pleural effusion. There is retrocardiac opacity at the left lung base which appears improved compared with the prior chest radiographs. This could reflect scarring, atelectasis or an infiltrate. The pulmonary vasculature is somewhat indistinct without overt pulmonary edema. A midthoracic compression deformity appears somewhat progressive compared with prior radiographs. IMPRESSION: 1. Vascular congestion without overt pulmonary edema. 2.  Left basilar pleuroparenchymal opacities appear improved compared with prior studies and may be chronic versus recurrent airspace disease and effusion. Radiographic follow up recommended. Electronically Signed   By: Richardean Sale M.D.   On: 12/04/2019 14:46    EKG: Rapid A. Fib, no significant ST-T changes  Assessment/Plan Active Problems:   Atrial fibrillation with RVR (HCC)   A-fib (HCC)  New onset A. fib with RVR, start patient on metoprolol to replace his atenolol, add some as needed metoprolol push, stop his HCTZ to give some room for more metoprolol if needed.  Discussed with patient caregiver grandson [name] over the phone, regarding  anticoagulation, patient has hemorrhoid bleeding last year, and has stopped since, never needed a transfusion and his H&H remained stable, function status is stable, walk with cane, no Hx of frequent falls. CHADS2 score>3, indicated for long-term anticoagulation, discussed with [name] [phone]) regarding risk and benefits, patient family willing to accept to start anticoagulation.  Start renal dosed Eliquis.  Question of diastolic CHF decompensation, probably related to fast heart rate, controlled heart rate, ordered echo.  Demanding ischemia/type II heart attack, probably from uncontrolled A. Fib, as above.  CKD stage III, stable, stop HCTZ probably less effective in this case.   DVT prophylaxis: Eliquis Code Status: DNR Family Communication: [name], the grandson Disposition Plan: Home Consults called: [facility]One Admission status: Tele obs.   [name] MD [facility] Pager [phone]  If 7PM-7AM, please contact night-coverage [email or web address] Password [facility]  12/04/2019, 5:41 PM

## 2019-12-04 NOTE — Discharge Instructions (Addendum)

## 2019-12-04 NOTE — Progress Notes (Signed)
[facility] for apixaban Indication: atrial fibrillation  Labs: Recent Labs    12/04/19 1444  HGB 14.9  HCT 44.7  PLT 162  CREATININE 2.10*    Assessment: 89 yom presenting with CP, found to be in afib with RVR. Pharmacy consulted to dose apixaban. Patient is not on anticoagulation PTA. Per MD note, patient's son reported patient had some hemorrhoid bleeding last year but has stopped since. No current active bleed issues documented. Family reported no history of frequent falls. CBC wnl and SCr 2.1 on admit.  Goal of Therapy:  Stroke prevention Monitor platelets by anticoagulation protocol: Yes   Plan:  Start apixaban 2.5mg  PO BID Monitor CBC, SCr, and for s/sx bleeding closely given advanced age  Elicia Lamp, PharmD, BCPS Clinical Pharmacist 12/04/2019 6:31 PM

## 2019-12-04 NOTE — ED Provider Notes (Signed)
[facility] EMERGENCY DEPARTMENT Provider Note   CSN: [id number] Arrival date & time: 12/04/19  1354     History Chief Complaint  Patient presents with  . Chest Pain    Clayton Hoffman is a 85 y.o. male.  85yo male 85 year old male with possible history of hypertension presents with complaint of shortness of breath.  Patient states for the past 1 to 2 weeks he has felt short of breath with exertion, not short of breath at rest, has had occasional pains on the left side of his chest, denies any chest pain today.  Patient has been taking 325 mg of Tylenol and 81 mg of aspirin every 4 hours which helps with his symptoms.  Patient denies any lower extremity edema, history of CHF, prior heart problems or arrhythmias.  No other complaints or concerns.        Past Medical History:  Diagnosis Date  . Hypertension     Patient Active Problem List   Diagnosis Date Noted  . LGI bleed 10/23/2018  . HTN (hypertension) 10/23/2018  . Rectal bleeding   . Acute respiratory failure with hypoxia (HCC)   . AKI (acute kidney injury) (HCC)   . Community acquired pneumonia   . Sepsis due to pneumonia (HCC) 06/17/2017  . Acute on chronic respiratory failure with hypoxia (HCC) 06/17/2017  . Urinary tract infection with hematuria 06/17/2017  . Hyponatremia 06/17/2017  . Elevated troponin 06/17/2017  . Acute renal failure (ARF) (HCC) 06/17/2017    Past Surgical History:  Procedure Laterality Date  . LUNG BIOPSY         No family history on file.  Social History   Tobacco Use  . Smoking status: Former Smoker    Years: 40.00    Types: Cigarettes  . Smokeless tobacco: Never Used  Substance Use Topics  . Alcohol use: No  . Drug use: No    Home Medications Prior to Admission medications   Medication Sig Start Date End Date Taking? Authorizing Provider  amLODipine (NORVASC) 5 MG tablet Take 1 tablet (5 mg total) by mouth daily. 10/24/18 10/24/19  Alwyn Ren, MD  atenolol (TENORMIN) 50 MG tablet Take 50 mg by mouth daily.    [provider]  bisacodyl (DULCOLAX) 5 MG EC tablet Take 2 tablets (10 mg total) by mouth daily. 10/25/18   Alwyn Ren, MD  hydrochlorothiazide (HYDRODIURIL) 12.5 MG tablet Take 12.5 mg by mouth daily. 09/27/18   [provider]  polyethylene glycol (MIRALAX / GLYCOLAX) packet Take 17 g by mouth 2 (two) times daily. 10/24/18   Alwyn Ren, MD  QUEtiapine (SEROQUEL) 25 MG tablet Take 25 mg by mouth at bedtime. 09/27/18   [provider]  senna-docusate (SENOKOT-S) 8.6-50 MG tablet Take 3 tablets by mouth 2 (two) times daily. 10/24/18   Alwyn Ren, MD    Allergies    Patient has no known allergies.  Review of Systems   Review of Systems  Constitutional: Negative for diaphoresis and fever.  Respiratory: Positive for shortness of breath.   Cardiovascular: Positive for chest pain. Negative for palpitations and leg swelling.  Gastrointestinal: Negative for abdominal pain, nausea and vomiting.  Musculoskeletal: Negative for arthralgias and myalgias.  Skin: Negative for rash and wound.  Allergic/Immunologic: Negative for immunocompromised state.  Neurological: Negative for dizziness and weakness.  Hematological: Does not bruise/bleed easily.  Psychiatric/Behavioral: Negative for confusion.  All other systems reviewed and are negative.   Physical Exam Updated  Vital Signs BP 138/88   Pulse 100   Resp (!) 25   SpO2 99%   Physical Exam Vitals and nursing note reviewed.  Constitutional:      General: He is not in acute distress.    Appearance: He is well-developed. He is not diaphoretic.  HENT:     Head: Normocephalic and atraumatic.  Cardiovascular:     Rate and Rhythm: Tachycardia present. Rhythm irregular.     Heart sounds: Normal heart sounds.  Pulmonary:     Effort: Pulmonary effort is normal.     Breath sounds: Normal breath sounds. No  decreased breath sounds.  Chest:     Chest wall: No tenderness.  Abdominal:     Palpations: Abdomen is soft.     Tenderness: There is no abdominal tenderness.  Musculoskeletal:     Right lower leg: No edema.     Left lower leg: No edema.  Skin:    General: Skin is warm and dry.     Findings: No erythema or rash.  Neurological:     Mental Status: He is alert and oriented to person, place, and time.  Psychiatric:        Behavior: Behavior normal.     ED Results / Procedures / Treatments   Labs (all labs ordered are listed, but only abnormal results are displayed) Labs Reviewed  CBC WITH DIFFERENTIAL/PLATELET  BASIC METABOLIC PANEL  BRAIN NATRIURETIC PEPTIDE  TROPONIN I (HIGH SENSITIVITY)    EKG EKG Interpretation  Date/Time:  Sunday December 04 2019 14:13:03 EST Ventricular Rate:  105 PR Interval:    QRS Duration: 98 QT Interval:  344 QTC Calculation: 455 R Axis:   14 Text Interpretation: Sinus tachycardia Premature atrial complexes Probable left atrial enlargement ST-t wave abnormality Abnormal ECG Confirmed by Gerhard MunchLockwood, Robert [phone]([id number]) on 12/04/2019 2:43:08 PM   Radiology DG Chest Port 1 View  Result Date: 12/04/2019 CLINICAL DATA:  Chest pain with exertional shortness of breath and difficulty urinating for 2 weeks. History of atrial fibrillation. EXAM: PORTABLE CHEST 1 VIEW COMPARISON:  Chest radiographs 06/17/2017. Abdominopelvic CT 10/23/2018. FINDINGS: 1429 hours. Two views obtained. The heart size and mediastinal contours are stable with aortic atherosclerosis. Left-greater-than-right biapical scarring appears stable. The pleural effusions demonstrated on prior abdominal CT have improved, although there is a possible small residual/recurrent left pleural effusion. There is retrocardiac opacity at the left lung base which appears improved compared with the prior chest radiographs. This could reflect scarring, atelectasis or an infiltrate. The pulmonary vasculature is  somewhat indistinct without overt pulmonary edema. A midthoracic compression deformity appears somewhat progressive compared with prior radiographs. IMPRESSION: 1. Vascular congestion without overt pulmonary edema. 2. Left basilar pleuroparenchymal opacities appear improved compared with prior studies and may be chronic versus recurrent airspace disease and effusion. Radiographic follow up recommended. Electronically Signed   By: Carey BullocksWilliam  Veazey M.D.   On: 12/04/2019 14:46    Procedures Procedures (including critical care time)  Medications Ordered in ED Medications - No data to display  ED Course  I have reviewed the triage vital signs and the nursing notes.  Pertinent labs & imaging results that were available during my care of the patient were reviewed by me and considered in my medical decision making (see chart for details).  Clinical Course as of Dec 04 1511  Sun Dec 04, 2019  [id number] 85 year old male with complaint of shortness of breath for the past 1 to 2 weeks, worse with exertion.  Patient also mentions a  burning pain on the left side of his chest but states he is not have this today.  Patient's been taking 81 mg of aspirin and 325 mg of Tylenol every 4 hours which seems to help with his symptoms. On exam patient is able to speak complete sentences, no lower extremity edema, heart rate is mildly tachycardic at about 100 and seems irregular.  EKG with sinus tachycardia and PACs, nonspecific ST-T wave abnormality. Chest x-ray with possible effusion. Care signed out to oncoming provider awaiting remaining lab work with plan for admission for further monitoring.   [LM]    Clinical Course User Index [LM] Roque Lias   MDM Rules/Calculators/A&P     Final Clinical Impression(s) / ED Diagnoses Final diagnoses:  None    Rx / DC Orders ED Discharge Orders    None       Roque Lias 12/04/19 1512    Carmin Muskrat, MD 12/06/19 1121

## 2019-12-04 NOTE — ED Notes (Signed)
ED TO INPATIENT HANDOFF REPORT  ED Nurse Name and Phone #: William Hamburger, RN   S Name/Age/Gender Clayton Hoffman 85 y.o. male Room/Bed: 033C/033C  Code Status   Code Status: DNR  Home/SNF/Other Home Patient oriented to: self, place, time and situation Is this baseline? No   Triage Complete: Triage complete  Chief Complaint A-fib [facility]) [I48.91]  Triage Note Pt arrives from home via [facility] with c/c of chest pain and exertional sob: also complaining on difficulty urinating x2 weeks. Pt was given 324 mg asa by ems. Pt was noted to be in afib rate of 120-150s with ems.   cbg 140      Allergies No Known Allergies  Level of Care/Admitting Diagnosis ED Disposition    ED Disposition Condition [facility] Area: [place] [[id number]]  Level of Care: Telemetry Medical [104]  I expect the patient will be discharged within 24 hours: Yes  LOW acuity---Tx typically complete <24 hrs---ACUTE conditions typically can be evaluated <24 hours---LABS likely to return to acceptable levels <24 hours---IS near functional baseline---EXPECTED to return to current living arrangement---NOT newly hypoxic: Does not meet criteria for 5C-Observation unit  Covid Evaluation: Asymptomatic Screening Protocol (No Symptoms)  Diagnosis: A-fib [facility]) [[id number]]  Admitting Physician: [name] [[id number]]  Attending Physician: [name] [[id number]]  PT Class (Do Not Modify): Observation [104]  PT Acc Code (Do Not Modify): Observation [10022]       B Medical/Surgery History Past Medical History:  Diagnosis Date  . Hypertension    Past Surgical History:  Procedure Laterality Date  . LUNG BIOPSY       A IV Location/Drains/Wounds Patient Lines/Drains/Airways Status   Active Line/Drains/Airways    Name:   Placement date:   Placement time:   Site:   Days:   Peripheral IV 12/04/19 Left Antecubital   12/04/19    1443    Antecubital   less than 1          Intake/Output Last  24 hours No intake or output data in the 24 hours ending 12/04/19 1740  Labs/Imaging Results for orders placed or performed during the hospital encounter of 12/04/19 (from the past 48 hour(s))  Basic metabolic panel     Status: Abnormal   Collection Time: 12/04/19  2:44 PM  Result Value Ref Range   Sodium 135 135 - 145 mmol/L   Potassium 4.7 3.5 - 5.1 mmol/L   Chloride 106 98 - 111 mmol/L   CO2 18 (L) 22 - 32 mmol/L   Glucose, Bld 106 (H) 70 - 99 mg/dL   BUN 37 (H) 8 - 23 mg/dL   Creatinine, Ser 2.10 (H) 0.61 - 1.24 mg/dL   Calcium 8.9 8.9 - 10.3 mg/dL   GFR calc non Af Amer 26 (L) >60 mL/min   GFR calc Af Amer 30 (L) >60 mL/min   Anion gap 11 5 - 15    Comment: Performed at [facility], [address] [place], [place] [address]  CBC with Differential     Status: None   Collection Time: 12/04/19  2:44 PM  Result Value Ref Range   WBC 7.6 4.0 - 10.5 K/uL   RBC 4.70 4.22 - 5.81 MIL/uL   Hemoglobin 14.9 13.0 - 17.0 g/dL   HCT 44.7 39.0 - 52.0 %   MCV 95.1 80.0 - 100.0 fL   MCH 31.7 26.0 - 34.0 pg   MCHC 33.3 30.0 - 36.0 g/dL  RDW 14.7 11.5 - 15.5 %   Platelets 162 150 - 400 K/uL   nRBC 0.0 0.0 - 0.2 %   Neutrophils Relative % 78 %   Neutro Abs 6.0 1.7 - 7.7 K/uL   Lymphocytes Relative 14 %   Lymphs Abs 1.0 0.7 - 4.0 K/uL   Monocytes Relative 6 %   Monocytes Absolute 0.5 0.1 - 1.0 K/uL   Eosinophils Relative 1 %   Eosinophils Absolute 0.1 0.0 - 0.5 K/uL   Basophils Relative 1 %   Basophils Absolute 0.1 0.0 - 0.1 K/uL   Immature Granulocytes 0 %   Abs Immature Granulocytes 0.03 0.00 - 0.07 K/uL    Comment: Performed at [facility], [address] [place], [place] [address]  Troponin I (High Sensitivity)     Status: Abnormal   Collection Time: 12/04/19  2:44 PM  Result Value Ref Range   Troponin I (High Sensitivity) 28 (H) <18 ng/L    Comment: (NOTE) Elevated high sensitivity troponin I (hsTnI) values and significant  changes across serial measurements  may suggest ACS but many other  chronic and acute conditions are known to elevate hsTnI results.  Refer to the "Links" section for chest pain algorithms and additional  guidance. Performed at [facility], [address] [place], [place] [address]   Brain natriuretic peptide     Status: Abnormal   Collection Time: 12/04/19  2:44 PM  Result Value Ref Range   B Natriuretic Peptide 1,568.1 (H) 0.0 - 100.0 pg/mL    Comment: Performed at [facility], [address] [place], [place] [address]   DG Chest Port 1 View  Result Date: 12/04/2019 CLINICAL DATA:  Chest pain with exertional shortness of breath and difficulty urinating for 2 weeks. History of atrial fibrillation. EXAM: PORTABLE CHEST 1 VIEW COMPARISON:  Chest radiographs 06/17/2017. Abdominopelvic CT 10/23/2018. FINDINGS: 1429 hours. Two views obtained. The heart size and mediastinal contours are stable with aortic atherosclerosis. Left-greater-than-right biapical scarring appears stable. The pleural effusions demonstrated on prior abdominal CT have improved, although there is a possible small residual/recurrent left pleural effusion. There is retrocardiac opacity at the left lung base which appears improved compared with the prior chest radiographs. This could reflect scarring, atelectasis or an infiltrate. The pulmonary vasculature is somewhat indistinct without overt pulmonary edema. A midthoracic compression deformity appears somewhat progressive compared with prior radiographs. IMPRESSION: 1. Vascular congestion without overt pulmonary edema. 2. Left basilar pleuroparenchymal opacities appear improved compared with prior studies and may be chronic versus recurrent airspace disease and effusion. Radiographic follow up recommended. Electronically Signed   By: Carey BullocksWilliam  Veazey M.D.   On: 12/04/2019 14:46    Pending Labs Unresulted Labs (From admission, onward)    Start     Ordered   12/04/19 1725  TSH  Once,   STAT      12/04/19 1725   12/04/19 1616  Urinalysis, Routine w reflex microscopic  Once,   STAT     12/04/19 1615   12/04/19 1603  SARS CORONAVIRUS 2 (TAT 6-24 HRS) Nasopharyngeal Nasopharyngeal Swab  (Tier 3 (TAT 6-24 hrs))  Once,   STAT    Question Answer Comment  Is this test for diagnosis or screening Screening   Symptomatic for COVID-19 as defined by CDC No   Hospitalized for COVID-19 No   Admitted to ICU for COVID-19 No   Previously tested for COVID-19 No   Resident in a congregate (group) care setting No  Employed in healthcare setting No      12/04/19 1603          Vitals/Pain Today's Vitals   12/04/19 1615 12/04/19 1630 12/04/19 1645 12/04/19 1700  BP: (!) 140/98 (!) 142/97 137/84 131/87  Pulse: (!) 114  (!) 120 80  Resp: 15 (!) 21 19 (!) 24  SpO2: 97%  100% 100%    Isolation Precautions No active isolations  Medications Medications  metoprolol tartrate (LOPRESSOR) tablet 25 mg (has no administration in time range)  amLODipine (NORVASC) tablet 5 mg (has no administration in time range)  QUEtiapine (SEROQUEL) tablet 25 mg (has no administration in time range)  bisacodyl (DULCOLAX) EC tablet 10 mg (has no administration in time range)  polyethylene glycol (MIRALAX / GLYCOLAX) packet 17 g (has no administration in time range)  senna-docusate (Senokot-S) tablet 3 tablet (has no administration in time range)  metoprolol tartrate (LOPRESSOR) injection 2.5 mg (has no administration in time range)  apixaban (ELIQUIS) tablet 2.5 mg (has no administration in time range)  furosemide (LASIX) injection 20 mg (20 mg Intravenous Given 12/04/19 1658)    Mobility walks     Focused Assessments Cardiac Assessment Handoff:  Cardiac Rhythm: Sinus tachycardia Lab Results  Component Value Date   TROPONINI 0.03 (HH) 06/18/2017   No results found for: DDIMER Does the Patient currently have chest pain? No     R Recommendations: See Admitting Provider Note  Report given to:    Additional Notes:  Hard of hearing,

## 2019-12-04 NOTE — ED Triage Notes (Signed)
Pt arrives from home via [facility] with c/c of chest pain and exertional sob: also complaining on difficulty urinating x2 weeks. Pt was given 324 mg asa by ems. Pt was noted to be in afib rate of 120-150s with ems.   cbg 140

## 2019-12-05 ENCOUNTER — Observation Stay (HOSPITAL_COMMUNITY): Payer: Medicare Other

## 2019-12-05 ENCOUNTER — Encounter (HOSPITAL_COMMUNITY): Payer: Self-pay | Admitting: Internal Medicine

## 2019-12-05 ENCOUNTER — Other Ambulatory Visit: Payer: Self-pay

## 2019-12-05 DIAGNOSIS — Z66 Do not resuscitate: Secondary | ICD-10-CM | POA: Diagnosis present

## 2019-12-05 DIAGNOSIS — I5032 Chronic diastolic (congestive) heart failure: Secondary | ICD-10-CM

## 2019-12-05 DIAGNOSIS — I5022 Chronic systolic (congestive) heart failure: Secondary | ICD-10-CM | POA: Diagnosis present

## 2019-12-05 DIAGNOSIS — I361 Nonrheumatic tricuspid (valve) insufficiency: Secondary | ICD-10-CM

## 2019-12-05 DIAGNOSIS — Z8619 Personal history of other infectious and parasitic diseases: Secondary | ICD-10-CM | POA: Diagnosis not present

## 2019-12-05 DIAGNOSIS — R072 Precordial pain: Secondary | ICD-10-CM | POA: Diagnosis present

## 2019-12-05 DIAGNOSIS — Z79899 Other long term (current) drug therapy: Secondary | ICD-10-CM | POA: Diagnosis not present

## 2019-12-05 DIAGNOSIS — R109 Unspecified abdominal pain: Secondary | ICD-10-CM | POA: Diagnosis not present

## 2019-12-05 DIAGNOSIS — I4891 Unspecified atrial fibrillation: Principal | ICD-10-CM

## 2019-12-05 DIAGNOSIS — N1832 Chronic kidney disease, stage 3b: Secondary | ICD-10-CM | POA: Diagnosis present

## 2019-12-05 DIAGNOSIS — I130 Hypertensive heart and chronic kidney disease with heart failure and stage 1 through stage 4 chronic kidney disease, or unspecified chronic kidney disease: Secondary | ICD-10-CM | POA: Diagnosis present

## 2019-12-05 DIAGNOSIS — R1013 Epigastric pain: Secondary | ICD-10-CM | POA: Diagnosis not present

## 2019-12-05 DIAGNOSIS — Z20828 Contact with and (suspected) exposure to other viral communicable diseases: Secondary | ICD-10-CM | POA: Diagnosis present

## 2019-12-05 DIAGNOSIS — Z8719 Personal history of other diseases of the digestive system: Secondary | ICD-10-CM | POA: Diagnosis not present

## 2019-12-05 DIAGNOSIS — I340 Nonrheumatic mitral (valve) insufficiency: Secondary | ICD-10-CM | POA: Diagnosis not present

## 2019-12-05 DIAGNOSIS — N189 Chronic kidney disease, unspecified: Secondary | ICD-10-CM | POA: Diagnosis not present

## 2019-12-05 DIAGNOSIS — H9190 Unspecified hearing loss, unspecified ear: Secondary | ICD-10-CM | POA: Diagnosis present

## 2019-12-05 DIAGNOSIS — Z87891 Personal history of nicotine dependence: Secondary | ICD-10-CM | POA: Diagnosis not present

## 2019-12-05 DIAGNOSIS — Z8249 Family history of ischemic heart disease and other diseases of the circulatory system: Secondary | ICD-10-CM | POA: Diagnosis not present

## 2019-12-05 LAB — TROPONIN I (HIGH SENSITIVITY): Troponin I (High Sensitivity): 69 ng/L — ABNORMAL HIGH (ref <18)

## 2019-12-05 LAB — ECHOCARDIOGRAM COMPLETE
Height: 69 in
Weight: 2401.6 oz

## 2019-12-05 MED ORDER — METOPROLOL TARTRATE 25 MG PO TABS
25.0000 mg | ORAL_TABLET | Freq: Once | ORAL | Status: AC
  Administered 2019-12-05: 25 mg via ORAL
  Filled 2019-12-05: qty 1

## 2019-12-05 MED ORDER — METOPROLOL TARTRATE 50 MG PO TABS
50.0000 mg | ORAL_TABLET | Freq: Two times a day (BID) | ORAL | Status: DC
  Administered 2019-12-05 – 2019-12-06 (×3): 50 mg via ORAL
  Filled 2019-12-05 (×3): qty 1

## 2019-12-05 MED ORDER — DILTIAZEM HCL-DEXTROSE 125-5 MG/125ML-% IV SOLN (PREMIX)
5.0000 mg/h | INTRAVENOUS | Status: DC
  Administered 2019-12-05: 5 mg/h via INTRAVENOUS
  Administered 2019-12-06 (×2): 15 mg/h via INTRAVENOUS
  Filled 2019-12-05 (×4): qty 125

## 2019-12-05 NOTE — Plan of Care (Signed)
  Problem: Cardiac: Goal: Ability to achieve and maintain adequate cardiopulmonary perfusion will improve Outcome: Progressing   

## 2019-12-05 NOTE — Progress Notes (Signed)
  Echocardiogram 2D Echocardiogram has been performed.  Clayton Hoffman A Trip Cavanagh 12/05/2019, 11:30 AM

## 2019-12-05 NOTE — Progress Notes (Signed)
PROGRESS NOTE    Clayton Hoffman  FOY:[id number] DOB: 12/22/1921 DOA: 12/04/2019 PCP: Patient, [name]  Brief Narrative: 85 year old male with history of hypertension, scant lower GI bleed in 10/2018 from hemorrhoids, stage III chronic kidney disease presented to the ED yesterday with intermittent chest discomfort that started a week ago, he noticed dyspnea when exerting himself during these times, his grandson took him to his PCP, who suspected A. fib he was started on some supplemental potassium, yesterday EMS was called due to persistent chest discomfort, he was noted to have rapid atrial fibrillation and mildly elevated troponin  Assessment & Plan:   New onset atrial fibrillation with RVR -Atenolol discontinued, continue metoprolol -we will increase dose to 50 mg twice daily, if heart rate not controlled on this may need to add Cardizem p.o. or drip temporarily -Started on renal dose Eliquis last night, after discussion of risk/benefits with patient and grandson [name] -TSH is unremarkable, follow-up 2D echocardiogram to be done today  Mildly elevated high-sensitivity troponin -In the setting of rapid A. fib and CKD 3 -Follow-up 2D echocardiogram, assess wall motion-no plan to pursue aggressive cardiac work-up given advanced age of 60, CKD etc. unless significant LV dysfunction noted on echo  Hypertension -Hold amlodipine today, metoprolol dose increased as discussed above, if needed will consider calcium channel blocker like Cardizem today  CKD 3-4 -Stable  DVT prophylaxis: Apixaban Code Status: DNR Family Communication: Family at bedside, admitting MD d/w grandson last evening Disposition Plan:   Procedures:   Antimicrobials:    Subjective: -Denies any chest pain today, reports mild shortness of breath with activity -Heart rate was uncontrolled overnight  Objective: Vitals:   12/04/19 2300 12/05/19 0404 12/05/19 0417 12/05/19 0745  BP:  (!) 142/90  131/90  Pulse:   (!) 102    Resp: 19  17 16   Temp:  (!) 97.5 F (36.4 C)    TempSrc:  Oral    SpO2:  100%  98%  Weight:  68.1 kg    Height:        Intake/Output Summary (Last 24 hours) at 12/05/2019 1140 Last data filed at 12/05/2019 0800 Gross per 24 hour  Intake -  Output 2100 ml  Net -2100 ml   Filed Weights   12/04/19 2041 12/05/19 0404  Weight: 69 kg 68.1 kg    Examination:  General exam: Pleasant elderly male thinly built, sitting up in a recliner, AAO x3, no distress, very hard of hearing Respiratory system: Poor air movement bilaterally, otherwise clear Cardiovascular system: S1-S2, irregularly irregular rhythm  gastrointestinal system: Abdomen is nondistended, soft and nontender.Normal bowel sounds heard. Central nervous system: Alert and oriented. No focal neurological deficits. Extremities: Edema Skin: No rashes, lesions or ulcers Psychiatry:  Mood & affect appropriate.     Data Reviewed:   CBC: Recent Labs  Lab 12/04/19 1444  WBC 7.6  NEUTROABS 6.0  HGB 14.9  HCT 44.7  MCV 95.1  PLT 767   Basic Metabolic Panel: Recent Labs  Lab 12/04/19 1444  NA 135  K 4.7  CL 106  CO2 18*  GLUCOSE 106*  BUN 37*  CREATININE 2.10*  CALCIUM 8.9   GFR: Estimated Creatinine Clearance: 20.3 mL/min (A) (by C-G formula based on SCr of 2.1 mg/dL (H)). Liver Function Tests: No results for input(s): AST, ALT, ALKPHOS, BILITOT, PROT, ALBUMIN in the last 168 hours. No results for input(s): LIPASE, AMYLASE in the last 168 hours. No results for input(s): AMMONIA in the last 168  hours. Coagulation Profile: No results for input(s): INR, PROTIME in the last 168 hours. Cardiac Enzymes: No results for input(s): CKTOTAL, CKMB, CKMBINDEX, TROPONINI in the last 168 hours. BNP (last 3 results) No results for input(s): PROBNP in the last 8760 hours. HbA1C: No results for input(s): HGBA1C in the last 72 hours. CBG: No results for input(s): GLUCAP in the last 168 hours. Lipid Profile:  No results for input(s): CHOL, HDL, LDLCALC, TRIG, CHOLHDL, LDLDIRECT in the last 72 hours. Thyroid Function Tests: Recent Labs    12/04/19 1927  TSH 3.837   Anemia Panel: No results for input(s): VITAMINB12, FOLATE, FERRITIN, TIBC, IRON, RETICCTPCT in the last 72 hours. Urine analysis:    Component Value Date/Time   COLORURINE YELLOW 12/04/2019 1744   APPEARANCEUR CLEAR 12/04/2019 1744   LABSPEC 1.012 12/04/2019 1744   PHURINE 5.0 12/04/2019 1744   GLUCOSEU NEGATIVE 12/04/2019 1744   HGBUR NEGATIVE 12/04/2019 1744   BILIRUBINUR NEGATIVE 12/04/2019 1744   KETONESUR NEGATIVE 12/04/2019 1744   PROTEINUR 100 (A) 12/04/2019 1744   NITRITE NEGATIVE 12/04/2019 1744   LEUKOCYTESUR NEGATIVE 12/04/2019 1744   Sepsis Labs: @LABRCNTIP (procalcitonin:4,lacticidven:4)  ) Recent Results (from the past 240 hour(s))  SARS CORONAVIRUS 2 (TAT 6-24 HRS) Nasopharyngeal Nasopharyngeal Swab     Status: None   Collection Time: 12/04/19  4:59 PM   Specimen: Nasopharyngeal Swab  Result Value Ref Range Status   SARS Coronavirus 2 NEGATIVE NEGATIVE Final    Comment: (NOTE) SARS-CoV-2 target nucleic acids are NOT DETECTED. The SARS-CoV-2 RNA is generally detectable in upper and lower respiratory specimens during the acute phase of infection. Negative results do not preclude SARS-CoV-2 infection, do not rule out co-infections with other pathogens, and should not be used as the sole basis for treatment or other patient management decisions. Negative results must be combined with clinical observations, patient history, and epidemiological information. The expected result is Negative. Fact Sheet for Patients: HairSlick.nohttps://www.fda.gov/media/138098/download Fact Sheet for Healthcare Providers: quierodirigir.comhttps://www.fda.gov/media/138095/download This test is not yet approved or cleared by the [place] FDA and  has been authorized for detection and/or diagnosis of SARS-CoV-2 by FDA under an Emergency Use  Authorization (EUA). This EUA will remain  in effect (meaning this test can be used) for the duration of the COVID-19 declaration under Section 56 4(b)(1) of the Act, 21 U.S.C. section 360bbb-3(b)(1), unless the authorization is terminated or revoked sooner. Performed at [facility], [address] [place], [place] [address]          Radiology Studies: DG Chest Port 1 View  Addendum Date: 12/05/2019   ADDENDUM REPORT: 12/05/2019 09:35 ADDENDUM: Stable compression of a midthoracic vertebral body. Electronically Signed   By: Bretta BangWilliam  Woodruff III M.D.   On: 12/05/2019 09:35   Result Date: 12/05/2019 CLINICAL DATA:  Congestive heart failure EXAM: PORTABLE CHEST 1 VIEW COMPARISON:  December 04, 2019 FINDINGS: There is persistent cardiomegaly with pulmonary vascularity within normal limits. There is ill-defined opacity in the left base, stable. There is a rather minimal left pleural effusion. There is persistent bilateral apical pleural thickening. The interstitium is thickened with questionable mild degree of interstitial edema. There is a calcified granuloma in the left lower lobe. There is aortic atherosclerosis.  No adenopathy. IMPRESSION: Stable cardiomegaly with pulmonary vascularity within normal limits. Ill-defined opacity left base appears stable. There may be scarring in this area. A degree of superimposed pneumonia in this area is question. There is a minimal left pleural effusion. Suspect trace interstitial edema. Calcified granuloma  left lower lobe, stable. No new opacity evident. Aortic Atherosclerosis (ICD10-I70.0). Electronically Signed: By: Bretta Bang III M.D. On: 12/05/2019 07:58   DG Chest Port 1 View  Result Date: 12/04/2019 CLINICAL DATA:  Chest pain with exertional shortness of breath and difficulty urinating for 2 weeks. History of atrial fibrillation. EXAM: PORTABLE CHEST 1 VIEW COMPARISON:  Chest radiographs 06/17/2017. Abdominopelvic CT 10/23/2018.  FINDINGS: 1429 hours. Two views obtained. The heart size and mediastinal contours are stable with aortic atherosclerosis. Left-greater-than-right biapical scarring appears stable. The pleural effusions demonstrated on prior abdominal CT have improved, although there is a possible small residual/recurrent left pleural effusion. There is retrocardiac opacity at the left lung base which appears improved compared with the prior chest radiographs. This could reflect scarring, atelectasis or an infiltrate. The pulmonary vasculature is somewhat indistinct without overt pulmonary edema. A midthoracic compression deformity appears somewhat progressive compared with prior radiographs. IMPRESSION: 1. Vascular congestion without overt pulmonary edema. 2. Left basilar pleuroparenchymal opacities appear improved compared with prior studies and may be chronic versus recurrent airspace disease and effusion. Radiographic follow up recommended. Electronically Signed   By: Carey Bullocks M.D.   On: 12/04/2019 14:46        Scheduled Meds: . apixaban  2.5 mg Oral BID  . bisacodyl  10 mg Oral Daily  . lactulose  10 g Oral TID  . metoprolol tartrate  50 mg Oral BID  . polyethylene glycol  17 g Oral BID  . QUEtiapine  25 mg Oral QHS  . senna-docusate  3 tablet Oral BID   Continuous Infusions: . diltiazem (CARDIZEM) infusion       LOS: 0 days    Time spent:  Zannie Cove, MD [facility]  12/05/2019, 11:40 AM

## 2019-12-05 NOTE — Progress Notes (Addendum)
Notified by CCMD that pt appeared to to ST on telemetry. EKG obtained to confirm. Jessie Foot, RN   Also notified that pts HR up to 130's non sustained. Pt Standing at bedside to urinate. Will continue to monitor. Jessie Foot, RN

## 2019-12-05 NOTE — Progress Notes (Signed)
Pt HR increasing to 140's-150's while ambulating in hall. Broadus John, MD paged. Pt asymptomatic. Will continue to monitor.

## 2019-12-06 DIAGNOSIS — N189 Chronic kidney disease, unspecified: Secondary | ICD-10-CM

## 2019-12-06 DIAGNOSIS — N183 Chronic kidney disease, stage 3 unspecified: Secondary | ICD-10-CM

## 2019-12-06 DIAGNOSIS — I5022 Chronic systolic (congestive) heart failure: Secondary | ICD-10-CM

## 2019-12-06 DIAGNOSIS — I5023 Acute on chronic systolic (congestive) heart failure: Secondary | ICD-10-CM

## 2019-12-06 LAB — CBC
HCT: 45.6 % (ref 39.0–52.0)
Hemoglobin: 15 g/dL (ref 13.0–17.0)
MCH: 31 pg (ref 26.0–34.0)
MCHC: 32.9 g/dL (ref 30.0–36.0)
MCV: 94.2 fL (ref 80.0–100.0)
Platelets: 161 10*3/uL (ref 150–400)
RBC: 4.84 MIL/uL (ref 4.22–5.81)
RDW: 14.7 % (ref 11.5–15.5)
WBC: 6.8 10*3/uL (ref 4.0–10.5)
nRBC: 0 % (ref 0.0–0.2)

## 2019-12-06 LAB — BASIC METABOLIC PANEL
Anion gap: 9 (ref 5–15)
BUN: 38 mg/dL — ABNORMAL HIGH (ref 8–23)
CO2: 23 mmol/L (ref 22–32)
Calcium: 9.3 mg/dL (ref 8.9–10.3)
Chloride: 104 mmol/L (ref 98–111)
Creatinine, Ser: 2.17 mg/dL — ABNORMAL HIGH (ref 0.61–1.24)
GFR calc Af Amer: 29 mL/min — ABNORMAL LOW (ref >60)
GFR calc non Af Amer: 25 mL/min — ABNORMAL LOW (ref >60)
Glucose, Bld: 115 mg/dL — ABNORMAL HIGH (ref 70–99)
Potassium: 4.5 mmol/L (ref 3.5–5.1)
Sodium: 136 mmol/L (ref 135–145)

## 2019-12-06 MED ORDER — ALUM & MAG HYDROXIDE-SIMETH 200-200-20 MG/5ML PO SUSP
30.0000 mL | Freq: Every day | ORAL | Status: DC | PRN
  Administered 2019-12-07: 30 mL via ORAL
  Filled 2019-12-06: qty 30

## 2019-12-06 MED ORDER — DILTIAZEM HCL ER COATED BEADS 240 MG PO CP24
240.0000 mg | ORAL_CAPSULE | Freq: Every day | ORAL | Status: DC
  Administered 2019-12-06 – 2019-12-07 (×2): 240 mg via ORAL
  Filled 2019-12-06 (×2): qty 1

## 2019-12-06 NOTE — Progress Notes (Signed)
Pt had large soft BM x2 this am and stated his hemorrhoid had started bleeding. He also states he had 5 BM's yesterday after lunch. Small amount of blood noted on toilet paper that patient saved to show me. Will continue to monitor. Jessie Foot, RN

## 2019-12-06 NOTE — Progress Notes (Signed)
PROGRESS NOTE    Clayton Hoffman   WUJ:[id number]  DOB: 1922-10-27  DOA: 12/04/2019 PCP: Patient, [name]   Brief Narrative:  Clayton Hoffman is a 85 year old male with hypertension, CKD 3 who is severely hard of hearing and presents with intermittent chest pain along with shortness of breath mainly on exertion. Patient is admitted for atrial fibrillation with RVR   Subjective: Patient has no complaints at this time    Assessment & Plan:   Principal Problem:   Atrial fibrillation with RVR-with chest pain/back pain and dyspnea on exertion -TSH is found to be normal -Atenolol has been discontinued -Metoprolol has been started and the patient is also on a Cardizem infusion at 15 cc/hr which is keeping his heart rate controlled -We will transition Cardizem to oral today but we will have to continue to monitor to see if heart rate remains controlled overnight before we decide on discharging him -2D echo notes dilated left atrium which is contributing to his A. fib-see also below  Active Problems:    Chronic systolic CHF (congestive heart failure) -this is a new finding -2D echo reveals an EF of 35 to 40% with global hypokinesis of the LV -At this time the patient appears to be euvolemic -A beta-blocker has been started as mentioned above -I will start an ARB as well    CKD (chronic kidney disease) stage 3, GFR 30-59 ml/min -Noted to be stable  Severely hard of hearing  Time spent in minutes: 35   DVT prophylaxis: Eliquis Code Status: DO NOT RESUSCITATE Family Communication:  Disposition Plan: Home in 1 to 2 days once it is determined that his heart rate is remaining controlled Consultants:   None Procedures:   2D echo 1. Left ventricular ejection fraction, by visual estimation, is 35 to 40%. The left ventricle has severely decreased function. There is no left ventricular hypertrophy.  2. The left ventricle demonstrates global hypokinesis.  3. Global right  ventricle has normal systolic function.The right ventricular size is normal. No increase in right ventricular wall thickness.  4. Left atrial size was moderately dilated.  5. Right atrial size was normal.  6. The mitral valve is normal in structure. Mild mitral valve regurgitation. No evidence of mitral stenosis.  7. The tricuspid valve is normal in structure. Tricuspid valve regurgitation moderate.  8. The aortic valve has an indeterminant number of cusps. Aortic valve regurgitation is mild. No evidence of aortic valve sclerosis or stenosis.  9. There is severe calcifcation of the aortic valve. 10. There is severe thickening of the aortic valve. 11. Pulmonic regurgitation is mild. 12. The pulmonic valve was normal in structure. Pulmonic valve regurgitation is mild. 13. Mildly elevated pulmonary artery systolic pressure. 14. The tricuspid regurgitant velocity is 2.77 m/s, and with an assumed right atrial pressure of 3 mmHg, the estimated right ventricular systolic pressure is mildly elevated at 33.6 mmHg. 15. The inferior vena cava is normal in size with greater than 50% respiratory variability, suggesting right atrial pressure of 3 mmHg. Antimicrobials:  Anti-infectives (From admission, onward)   None       Objective: Vitals:   12/05/19 2119 12/06/19 0429 12/06/19 0500 12/06/19 1435  BP:    133/89  Pulse: (!) 102   99  Resp:  20    Temp: (!) 97.4 F (36.3 C)  97.6 F (36.4 C) 97.8 F (36.6 C)  TempSrc: Oral   Oral  SpO2: 99% 97%  98%  Weight:  67.8 kg  Height:        Intake/Output Summary (Last 24 hours) at 12/06/2019 1550 Last data filed at 12/06/2019 1300 Gross per 24 hour  Intake 683.76 ml  Output 900 ml  Net -216.24 ml   Filed Weights   12/04/19 2041 12/05/19 0404 12/06/19 0429  Weight: 69 kg 68.1 kg 67.8 kg    Examination: General exam: Appears comfortable  HEENT: PERRLA, oral mucosa moist, no sclera icterus or thrush Respiratory system: Clear to  auscultation. Respiratory effort normal. Cardiovascular system: S1 & S2 heard, irregularly irregular rate and rhythm.   Gastrointestinal system: Abdomen soft, non-tender, nondistended. Normal bowel sounds. Central nervous system: Alert and oriented. No focal neurological deficits. Extremities: No cyanosis, clubbing or edema Skin: No rashes or ulcers Psychiatry:  Mood & affect appropriate.     Data Reviewed: I have personally reviewed following labs and imaging studies  CBC: Recent Labs  Lab 12/04/19 1444 12/06/19 0337  WBC 7.6 6.8  NEUTROABS 6.0  --   HGB 14.9 15.0  HCT 44.7 45.6  MCV 95.1 94.2  PLT 162 161   Basic Metabolic Panel: Recent Labs  Lab 12/04/19 1444 12/06/19 0337  NA 135 136  K 4.7 4.5  CL 106 104  CO2 18* 23  GLUCOSE 106* 115*  BUN 37* 38*  CREATININE 2.10* 2.17*  CALCIUM 8.9 9.3   GFR: Estimated Creatinine Clearance: 19.5 mL/min (A) (by C-G formula based on SCr of 2.17 mg/dL (H)). Liver Function Tests: No results for input(s): AST, ALT, ALKPHOS, BILITOT, PROT, ALBUMIN in the last 168 hours. No results for input(s): LIPASE, AMYLASE in the last 168 hours. No results for input(s): AMMONIA in the last 168 hours. Coagulation Profile: No results for input(s): INR, PROTIME in the last 168 hours. Cardiac Enzymes: No results for input(s): CKTOTAL, CKMB, CKMBINDEX, TROPONINI in the last 168 hours. BNP (last 3 results) No results for input(s): PROBNP in the last 8760 hours. HbA1C: No results for input(s): HGBA1C in the last 72 hours. CBG: No results for input(s): GLUCAP in the last 168 hours. Lipid Profile: No results for input(s): CHOL, HDL, LDLCALC, TRIG, CHOLHDL, LDLDIRECT in the last 72 hours. Thyroid Function Tests: Recent Labs    12/04/19 1927  TSH 3.837   Anemia Panel: No results for input(s): VITAMINB12, FOLATE, FERRITIN, TIBC, IRON, RETICCTPCT in the last 72 hours. Urine analysis:    Component Value Date/Time   COLORURINE YELLOW  12/04/2019 1744   APPEARANCEUR CLEAR 12/04/2019 1744   LABSPEC 1.012 12/04/2019 1744   PHURINE 5.0 12/04/2019 1744   GLUCOSEU NEGATIVE 12/04/2019 1744   HGBUR NEGATIVE 12/04/2019 1744   BILIRUBINUR NEGATIVE 12/04/2019 1744   KETONESUR NEGATIVE 12/04/2019 1744   PROTEINUR 100 (A) 12/04/2019 1744   NITRITE NEGATIVE 12/04/2019 1744   LEUKOCYTESUR NEGATIVE 12/04/2019 1744   Sepsis Labs: @LABRCNTIP (procalcitonin:4,lacticidven:4) ) Recent Results (from the past 240 hour(s))  SARS CORONAVIRUS 2 (TAT 6-24 HRS) Nasopharyngeal Nasopharyngeal Swab     Status: None   Collection Time: 12/04/19  4:59 PM   Specimen: Nasopharyngeal Swab  Result Value Ref Range Status   SARS Coronavirus 2 NEGATIVE NEGATIVE Final    Comment: (NOTE) SARS-CoV-2 target nucleic acids are NOT DETECTED. The SARS-CoV-2 RNA is generally detectable in upper and lower respiratory specimens during the acute phase of infection. Negative results do not preclude SARS-CoV-2 infection, do not rule out co-infections with other pathogens, and should not be used as the sole basis for treatment or other patient management decisions. Negative results must be combined  with clinical observations, patient history, and epidemiological information. The expected result is Negative. Fact Sheet for Patients: HairSlick.no Fact Sheet for Healthcare Providers: quierodirigir.com This test is not yet approved or cleared by the [place] FDA and  has been authorized for detection and/or diagnosis of SARS-CoV-2 by FDA under an Emergency Use Authorization (EUA). This EUA will remain  in effect (meaning this test can be used) for the duration of the COVID-19 declaration under Section 56 4(b)(1) of the Act, 21 U.S.C. section 360bbb-3(b)(1), unless the authorization is terminated or revoked sooner. Performed at [facility], [address], [place], [place] [address]           Radiology Studies: DG Chest Port 1 View  Addendum Date: 12/05/2019   ADDENDUM REPORT: 12/05/2019 09:35 ADDENDUM: Stable compression of a midthoracic vertebral body. Electronically Signed   By: Bretta Bang III M.D.   On: 12/05/2019 09:35   Result Date: 12/05/2019 CLINICAL DATA:  Congestive heart failure EXAM: PORTABLE CHEST 1 VIEW COMPARISON:  December 04, 2019 FINDINGS: There is persistent cardiomegaly with pulmonary vascularity within normal limits. There is ill-defined opacity in the left base, stable. There is a rather minimal left pleural effusion. There is persistent bilateral apical pleural thickening. The interstitium is thickened with questionable mild degree of interstitial edema. There is a calcified granuloma in the left lower lobe. There is aortic atherosclerosis.  No adenopathy. IMPRESSION: Stable cardiomegaly with pulmonary vascularity within normal limits. Ill-defined opacity left base appears stable. There may be scarring in this area. A degree of superimposed pneumonia in this area is question. There is a minimal left pleural effusion. Suspect trace interstitial edema. Calcified granuloma left lower lobe, stable. No new opacity evident. Aortic Atherosclerosis (ICD10-I70.0). Electronically Signed: By: Bretta Bang III M.D. On: 12/05/2019 07:58   ECHOCARDIOGRAM COMPLETE  Result Date: 12/05/2019   ECHOCARDIOGRAM REPORT   Patient Name:   Clayton Hoffman Date of Exam: 12/05/2019 Medical Rec #:  [id number]        Height:       69.0 in Accession #:    [id number]       Weight:       150.1 lb Date of Birth:  10/15/1922         BSA:          1.83 m Patient Age:    89 years         BP:           131/90 mmHg Patient Gender: M                HR:           102 bpm. Exam Location:  Inpatient Procedure: 2D Echo Indications:    Atrial Fibrillation 427.31 / I48.91  History:        Patient has no prior history of Echocardiogram examinations.                 Risk Factors:Hypertension.  Acute renal failure.  Sonographer:    Leeroy Bock Turrentine Referring Phys: [id number] Emeline General IMPRESSIONS  1. Left ventricular ejection fraction, by visual estimation, is 35 to 40%. The left ventricle has severely decreased function. There is no left ventricular hypertrophy.  2. The left ventricle demonstrates global hypokinesis.  3. Global right ventricle has normal systolic function.The right ventricular size is normal. No increase in right ventricular wall thickness.  4. Left atrial size was moderately dilated.  5. Right atrial size was normal.  6. The mitral valve is normal in structure. Mild mitral valve regurgitation. No evidence of mitral stenosis.  7. The tricuspid valve is normal in structure. Tricuspid valve regurgitation moderate.  8. The aortic valve has an indeterminant number of cusps. Aortic valve regurgitation is mild. No evidence of aortic valve sclerosis or stenosis.  9. There is severe calcifcation of the aortic valve. 10. There is severe thickening of the aortic valve. 11. Pulmonic regurgitation is mild. 12. The pulmonic valve was normal in structure. Pulmonic valve regurgitation is mild. 13. Mildly elevated pulmonary artery systolic pressure. 14. The tricuspid regurgitant velocity is 2.77 m/s, and with an assumed right atrial pressure of 3 mmHg, the estimated right ventricular systolic pressure is mildly elevated at 33.6 mmHg. 15. The inferior vena cava is normal in size with greater than 50% respiratory variability, suggesting right atrial pressure of 3 mmHg. FINDINGS  Left Ventricle: Left ventricular ejection fraction, by visual estimation, is 35 to 40%. The left ventricle has severely decreased function. The left ventricle demonstrates global hypokinesis. There is no left ventricular hypertrophy. Normal left atrial pressure. Right Ventricle: The right ventricular size is normal. No increase in right ventricular wall thickness. Global RV systolic function is has normal systolic function. The  tricuspid regurgitant velocity is 2.77 m/s, and with an assumed right atrial pressure  of 3 mmHg, the estimated right ventricular systolic pressure is mildly elevated at 33.6 mmHg. Left Atrium: Left atrial size was moderately dilated. Right Atrium: Right atrial size was normal in size Pericardium: There is no evidence of pericardial effusion. Mitral Valve: The mitral valve is normal in structure. Mild mitral valve regurgitation. No evidence of mitral valve stenosis by observation. Tricuspid Valve: The tricuspid valve is normal in structure. Tricuspid valve regurgitation moderate. Aortic Valve: The aortic valve has an indeterminant number of cusps. . There is severe thickening and severe calcifcation of the aortic valve. Aortic valve regurgitation is mild. Aortic regurgitation PHT measures 292 msec. The aortic valve is structurally normal, with no evidence of sclerosis or stenosis. There is severe thickening of the aortic valve. There is severe calcifcation of the aortic valve. Aortic valve mean gradient measures 12.0 mmHg. Aortic valve peak gradient measures 19.8 mmHg. Aortic valve area, by VTI measures 0.73 cm. Pulmonic Valve: The pulmonic valve was normal in structure. Pulmonic valve regurgitation is mild. Pulmonic regurgitation is mild. Aorta: The aortic root, ascending aorta and aortic arch are all structurally normal, with no evidence of dilitation or obstruction. Venous: The inferior vena cava is normal in size with greater than 50% respiratory variability, suggesting right atrial pressure of 3 mmHg. IAS/Shunts: No atrial level shunt detected by color flow Doppler. There is no evidence of a patent foramen ovale. No ventricular septal defect is seen or detected. There is no evidence of an atrial septal defect.  LEFT VENTRICLE PLAX 2D LVIDd:         5.29 cm LVIDs:         4.21 cm LV PW:         1.02 cm LV IVS:        1.03 cm LVOT diam:     1.90 cm LV SV:         56 ml LV SV Index:   30.61 LVOT Area:     2.84  cm  RIGHT VENTRICLE RV S prime:     6.85 cm/s TAPSE (M-mode): 1.3 cm LEFT ATRIUM              Index  RIGHT ATRIUM           Index LA diam:        5.00 cm  2.73 cm/m  RA Area:     26.10 cm LA Vol (A2C):   104.0 ml 56.87 ml/m RA Volume:   86.20 ml  47.14 ml/m LA Vol (A4C):   84.0 ml  45.93 ml/m LA Biplane Vol: 92.5 ml  50.58 ml/m  AORTIC VALVE AV Area (Vmax):    0.81 cm AV Area (Vmean):   0.76 cm AV Area (VTI):     0.73 cm AV Vmax:           222.25 cm/s AV Vmean:          165.250 cm/s AV VTI:            0.429 m AV Peak Grad:      19.8 mmHg AV Mean Grad:      12.0 mmHg LVOT Vmax:         63.30 cm/s LVOT Vmean:        44.100 cm/s LVOT VTI:          0.110 m LVOT/AV VTI ratio: 0.26 AI PHT:            292 msec  AORTA Ao Root diam: 2.80 cm MR Peak grad: 106.5 mmHg  TRICUSPID VALVE MR Mean grad: 64.0 mmHg   TR Peak grad:   30.6 mmHg MR Vmax:      516.00 cm/s TR Vmax:        295.00 cm/s MR Vmean:     380.0 cm/s                           SHUNTS                           Systemic VTI:  0.11 m                           Systemic Diam: 1.90 cm  Donato Schultz MD Electronically signed by Donato Schultz MD Signature Date/Time: 12/05/2019/1:10:52 PM    Final       Scheduled Meds: . apixaban  2.5 mg Oral BID  . bisacodyl  10 mg Oral Daily  . diltiazem  240 mg Oral Daily  . metoprolol tartrate  50 mg Oral BID  . polyethylene glycol  17 g Oral BID  . QUEtiapine  25 mg Oral QHS  . senna-docusate  3 tablet Oral BID   Continuous Infusions:   LOS: 1 day      Calvert Cantor, MD [facility] Pager: [email or web address] Password [email or web address] 12/06/2019, 3:50 PM

## 2019-12-06 NOTE — Plan of Care (Signed)
Pt remains on IV Cardizem as well as PO Metoprolol. HR remains upper 90's to low 100's. Will continue to monitor. .me

## 2019-12-07 ENCOUNTER — Inpatient Hospital Stay (HOSPITAL_COMMUNITY): Payer: Medicare Other

## 2019-12-07 DIAGNOSIS — R1013 Epigastric pain: Secondary | ICD-10-CM

## 2019-12-07 MED ORDER — METOPROLOL TARTRATE 50 MG PO TABS
75.0000 mg | ORAL_TABLET | Freq: Two times a day (BID) | ORAL | Status: DC
  Administered 2019-12-07: 75 mg via ORAL
  Filled 2019-12-07 (×2): qty 1

## 2019-12-07 MED ORDER — METOPROLOL TARTRATE 100 MG PO TABS
100.0000 mg | ORAL_TABLET | Freq: Two times a day (BID) | ORAL | Status: DC
  Administered 2019-12-07: 100 mg via ORAL
  Filled 2019-12-07: qty 1

## 2019-12-07 MED ORDER — CALCIUM CARBONATE ANTACID 500 MG PO CHEW
400.0000 mg | CHEWABLE_TABLET | Freq: Three times a day (TID) | ORAL | Status: DC | PRN
  Administered 2019-12-07: 400 mg via ORAL
  Filled 2019-12-07: qty 2

## 2019-12-07 MED ORDER — FAMOTIDINE 20 MG PO TABS
20.0000 mg | ORAL_TABLET | Freq: Two times a day (BID) | ORAL | Status: DC
  Administered 2019-12-07 – 2019-12-08 (×3): 20 mg via ORAL
  Filled 2019-12-07 (×3): qty 1

## 2019-12-07 NOTE — Care Management (Signed)
1019 12-07-19 Benefits Check submitted for Eliquis. Case Manager will make patient aware of cost once completed. Bethena Roys, RN,BSN Case Manager [phone]

## 2019-12-07 NOTE — TOC Benefit Eligibility Note (Signed)
Transition of Care [facility]) Benefit Eligibility Note    Patient Details  Name: Clayton Hoffman MRN: [id number] Date of Birth: 03/16/1922   Medication/Dose: Arne Cleveland tablet 2.5 mg  :  Dose 2.5 mg  :  Oral  :  2 times daily.  Covered?: Yes  Tier: 3 Drug  Prescription Coverage Preferred Pharmacy: Any retail Pharmacy Apixaban non formulary  Spoke with Person/Company/Phone Number:: [place]/ [employer]? [phone]  Co-Pay: 47.00 for a 30 day supply retail/ 131.00 for a 90 day supply mail order  Prior Approval: No  Deductible: Unmet(50.00)       Orbie Pyo Phone Number: 12/07/2019, 12:11 PM

## 2019-12-07 NOTE — Progress Notes (Signed)
Pt OOB to BR. HR increased to 140's. Pt asymptomatic. HR back to low 100's . Will continue to monitor. Jessie Foot, RN

## 2019-12-07 NOTE — Progress Notes (Signed)
PROGRESS NOTE    WHITMAN MEINHARDT   YIA:[id number]  DOB: 1922-10-04  DOA: 12/04/2019 PCP: Patient, [name]   Brief Narrative:  CADE DASHNER is a 85 year old male with hypertension, CKD 3 who is severely hard of hearing and presents with intermittent chest pain along with shortness of breath mainly on exertion. Patient is admitted for atrial fibrillation with RVR   Subjective: The patient is complaining of abdominal pain. He is asking for Tums.  I called back a few hours later to check on him. He is still having pain. He has barely eaten today.     Assessment & Plan:   Principal Problem:   Atrial fibrillation with RVR-with chest pain/back pain and dyspnea on exertion -TSH is found to be normal -Atenolol has been discontinued -Metoprolol has been started and the patient is also on a Cardizem infusion at 15 cc/hr which is keeping his heart rate controlled - transitioned Cardizem to oral however HR still going up to 120s at rest- will follow HR and BP and decide if I need to increase his medications furtehr   -2D echo notes dilated left atrium which is contributing to his A. fib-see also below  Active Problems:  Acute abdominal pain - tender in mid abdomen  Seems quite severe to be just Reflux- will obtain abdominal xray      Chronic systolic CHF (congestive heart failure) -this is a new finding -2D echo reveals an EF of 35 to 40% with global hypokinesis of the LV -At this time the patient appears to be euvolemic -A beta-blocker has been started as mentioned above -I will start an ARB as well    CKD (chronic kidney disease) stage 3, GFR 30-59 ml/min -Noted to be stable  Severely hard of hearing  Time spent in minutes: 35   DVT prophylaxis: Eliquis Code Status: DO NOT RESUSCITATE Family Communication:  Disposition Plan: Home in 1 to 2 days once it is determined that his heart rate is remaining controlled, his abdominal pain has improved and he is tolerating a diet  again Consultants:   None Procedures:   2D echo 1. Left ventricular ejection fraction, by visual estimation, is 35 to 40%. The left ventricle has severely decreased function. There is no left ventricular hypertrophy.  2. The left ventricle demonstrates global hypokinesis.  3. Global right ventricle has normal systolic function.The right ventricular size is normal. No increase in right ventricular wall thickness.  4. Left atrial size was moderately dilated.  5. Right atrial size was normal.  6. The mitral valve is normal in structure. Mild mitral valve regurgitation. No evidence of mitral stenosis.  7. The tricuspid valve is normal in structure. Tricuspid valve regurgitation moderate.  8. The aortic valve has an indeterminant number of cusps. Aortic valve regurgitation is mild. No evidence of aortic valve sclerosis or stenosis.  9. There is severe calcifcation of the aortic valve. 10. There is severe thickening of the aortic valve. 11. Pulmonic regurgitation is mild. 12. The pulmonic valve was normal in structure. Pulmonic valve regurgitation is mild. 13. Mildly elevated pulmonary artery systolic pressure. 14. The tricuspid regurgitant velocity is 2.77 m/s, and with an assumed right atrial pressure of 3 mmHg, the estimated right ventricular systolic pressure is mildly elevated at 33.6 mmHg. 15. The inferior vena cava is normal in size with greater than 50% respiratory variability, suggesting right atrial pressure of 3 mmHg. Antimicrobials:  Anti-infectives (From admission, onward)   None  Objective: Vitals:   12/06/19 1435 12/06/19 2100 12/07/19 0523 12/07/19 0532  BP: 133/89 129/77 (!) 150/83   Pulse: 99 95 99   Resp:  (!) 21 19 18   Temp: 97.8 F (36.6 C) 98 F (36.7 C)  (!) 97.5 F (36.4 C)  TempSrc: Oral Oral  Oral  SpO2: 98% 99% 100%   Weight:   66.5 kg   Height:        Intake/Output Summary (Last 24 hours) at 12/07/2019 1250 Last data filed at 12/07/2019  0500 Gross per 24 hour  Intake 560 ml  Output 1457 ml  Net -897 ml   Filed Weights   12/05/19 0404 12/06/19 0429 12/07/19 0523  Weight: 68.1 kg 67.8 kg 66.5 kg    Examination: General exam: Appears uncomfortable - clutching his abdomen due to pain HEENT: PERRLA, oral mucosa moist, no sclera icterus or thrush Respiratory system: Clear to auscultation. Respiratory effort normal. Cardiovascular system: S1 & S2 heard,  No murmurs - IIRR Gastrointestinal system: Abdomen soft, tender in mid abdomen, nondistended. Normal bowel sounds   Central nervous system: Alert and oriented. No focal neurological deficits. Extremities: No cyanosis, clubbing or edema Skin: No rashes or ulcers Psychiatry:  mood and affect normal     Data Reviewed: I have personally reviewed following labs and imaging studies  CBC: Recent Labs  Lab 12/04/19 1444 12/06/19 0337  WBC 7.6 6.8  NEUTROABS 6.0  --   HGB 14.9 15.0  HCT 44.7 45.6  MCV 95.1 94.2  PLT 162 967   Basic Metabolic Panel: Recent Labs  Lab 12/04/19 1444 12/06/19 0337  NA 135 136  K 4.7 4.5  CL 106 104  CO2 18* 23  GLUCOSE 106* 115*  BUN 37* 38*  CREATININE 2.10* 2.17*  CALCIUM 8.9 9.3   GFR: Estimated Creatinine Clearance: 19.2 mL/min (A) (by C-G formula based on SCr of 2.17 mg/dL (H)). Liver Function Tests: No results for input(s): AST, ALT, ALKPHOS, BILITOT, PROT, ALBUMIN in the last 168 hours. No results for input(s): LIPASE, AMYLASE in the last 168 hours. No results for input(s): AMMONIA in the last 168 hours. Coagulation Profile: No results for input(s): INR, PROTIME in the last 168 hours. Cardiac Enzymes: No results for input(s): CKTOTAL, CKMB, CKMBINDEX, TROPONINI in the last 168 hours. BNP (last 3 results) No results for input(s): PROBNP in the last 8760 hours. HbA1C: No results for input(s): HGBA1C in the last 72 hours. CBG: No results for input(s): GLUCAP in the last 168 hours. Lipid Profile: No results for  input(s): CHOL, HDL, LDLCALC, TRIG, CHOLHDL, LDLDIRECT in the last 72 hours. Thyroid Function Tests: Recent Labs    12/04/19 1927  TSH 3.837   Anemia Panel: No results for input(s): VITAMINB12, FOLATE, FERRITIN, TIBC, IRON, RETICCTPCT in the last 72 hours. Urine analysis:    Component Value Date/Time   COLORURINE YELLOW 12/04/2019 [place] 12/04/2019 1744   LABSPEC 1.012 12/04/2019 1744   PHURINE 5.0 12/04/2019 1744   GLUCOSEU NEGATIVE 12/04/2019 1744   HGBUR NEGATIVE 12/04/2019 1744   [place] 12/04/2019 1744   KETONESUR NEGATIVE 12/04/2019 1744   PROTEINUR 100 (A) 12/04/2019 1744   NITRITE NEGATIVE 12/04/2019 1744   LEUKOCYTESUR NEGATIVE 12/04/2019 1744   Sepsis Labs: @LABRCNTIP (procalcitonin:4,lacticidven:4) ) Recent Results (from the past 240 hour(s))  SARS CORONAVIRUS 2 (TAT 6-24 HRS) Nasopharyngeal Nasopharyngeal Swab     Status: None   Collection Time: 12/04/19  4:59 PM   Specimen: Nasopharyngeal Swab  Result Value  Ref Range Status   SARS Coronavirus 2 NEGATIVE NEGATIVE Final    Comment: (NOTE) SARS-CoV-2 target nucleic acids are NOT DETECTED. The SARS-CoV-2 RNA is generally detectable in upper and lower respiratory specimens during the acute phase of infection. Negative results do not preclude SARS-CoV-2 infection, do not rule out co-infections with other pathogens, and should not be used as the sole basis for treatment or other patient management decisions. Negative results must be combined with clinical observations, patient history, and epidemiological information. The expected result is Negative. Fact Sheet for Patients: HairSlick.nohttps://www.fda.gov/media/138098/download Fact Sheet for Healthcare Providers: quierodirigir.comhttps://www.fda.gov/media/138095/download This test is not yet approved or cleared by the [place] FDA and  has been authorized for detection and/or diagnosis of SARS-CoV-2 by FDA under an Emergency Use Authorization (EUA). This  EUA will remain  in effect (meaning this test can be used) for the duration of the COVID-19 declaration under Section 56 4(b)(1) of the Act, 21 U.S.C. section 360bbb-3(b)(1), unless the authorization is terminated or revoked sooner. Performed at [facility], [address] [place], [place] [address]          Radiology Studies: No results found.    Scheduled Meds: . apixaban  2.5 mg Oral BID  . bisacodyl  10 mg Oral Daily  . diltiazem  240 mg Oral Daily  . metoprolol tartrate  75 mg Oral BID  . polyethylene glycol  17 g Oral BID  . QUEtiapine  25 mg Oral QHS  . senna-docusate  3 tablet Oral BID   Continuous Infusions:   LOS: 2 days      Calvert CantorSaima Areta Terwilliger, MD [facility] Pager: [email or web address] Password [email or web address] 12/07/2019, 12:50 PM

## 2019-12-08 MED ORDER — ACETAMINOPHEN 325 MG PO TABS: 650.0000 mg | ORAL_TABLET | Freq: Four times a day (QID) | ORAL | Status: DC | PRN

## 2019-12-08 MED ORDER — METOPROLOL TARTRATE 50 MG PO TABS
150.0000 mg | ORAL_TABLET | Freq: Two times a day (BID) | ORAL | Status: DC
  Administered 2019-12-08: 150 mg via ORAL
  Filled 2019-12-08: qty 1

## 2019-12-08 MED ORDER — METOPROLOL TARTRATE 75 MG PO TABS: 150.0000 mg | ORAL_TABLET | Freq: Two times a day (BID) | ORAL | 1 refills | Status: AC

## 2019-12-08 MED ORDER — FAMOTIDINE 20 MG PO TABS: 20.0000 mg | ORAL_TABLET | Freq: Two times a day (BID) | ORAL | 0 refills | Status: AC | PRN

## 2019-12-08 MED ORDER — APIXABAN 2.5 MG PO TABS: 2.5000 mg | ORAL_TABLET | Freq: Two times a day (BID) | ORAL | 1 refills | Status: AC

## 2019-12-08 MED FILL — FAMOTIDINE 20 MG TABS: 20 | 30 days supply | Qty: 60 | Fill #0

## 2019-12-08 MED FILL — ELIQUIS 2.5 MG TABLET: 2.5 | 30 days supply | Qty: 60 | Fill #0

## 2019-12-08 MED FILL — METOPROLOL TARTRATE 100 MG: 100 | 30 days supply | Qty: 90 | Fill #0

## 2019-12-08 NOTE — Care Management Important Message (Signed)
Important Message  Patient Details  Name: Clayton Hoffman MRN: [id number] Date of Birth: 08-Sep-1922   Medicare Important Message Given:  Yes     [name] [name] 12/08/2019, 11:28 AM

## 2019-12-08 NOTE — Progress Notes (Signed)
appt made with a fib clinic 12/15/19 at 10:30 AM

## 2019-12-11 NOTE — Discharge Summary (Signed)
Physician Discharge Summary  Clayton Hoffman IHK:[id number] DOB: 09-13-1922 DOA: 12/04/2019  PCP: Patient, [name]  Admit date: 12/04/2019 Discharge date: 12/11/2019  Admitted From:  home Disposition:  home     Discharge Condition:  stable   CODE STATUS:  Full code   Diet recommendation:  Heart healthy Consultations:  none    Discharge Diagnoses:  Principal Problem:   Atrial fibrillation with RVR ([place]) Active Problems:   CKD (chronic kidney disease) stage 3, GFR 30-59 ml/min   Chronic systolic CHF (congestive heart failure) ([place])   Hard of hearing    Brief Summary: Clayton Hoffman is a 85 year old male with hypertension, CKD 3 who is severely hard of hearing and presents with intermittent chest pain along with shortness of breath mainly on exertion. Patient is admitted for atrial fibrillation with RVR   Hospital Course:  Principal Problem:   Atrial fibrillation with RVR-with chest pain/back pain and dyspnea on exertion -TSH is found to be normal -Atenolol has been discontinued -Metoprolol has been started and the patient is also on a Cardizem infusion at 15 cc/hr which is keeping his heart rate controlled - transitioned Cardizem to oral however HR was subsequently going up to 120s at rest- started on Metoprolol and titrated up to wean off Cardizem- rate controlled -2D echo notes dilated left atrium which is contributing to his A. fib-see also below  Active Problems:    Chronic systolic CHF (congestive heart failure) -this is a new finding -2D echo reveals an EF of 35 to 40% with global hypokinesis of the LV -At this time the patient appears to be euvolemic -A beta-blocker has been started as mentioned above    CKD (chronic kidney disease) stage 3, GFR 30-59 ml/min -Noted to be stable  Severely hard of hearing     Discharge Exam: Vitals:   12/07/19 2144 12/08/19 0624  BP: 112/69 117/67  Pulse: (!) 56 84  Resp:    Temp: 97.7 F (36.5 C) 98.7 F  (37.1 C)  SpO2: 96% 98%   Vitals:   12/07/19 1300 12/07/19 1307 12/07/19 2144 12/08/19 0624  BP: 134/79  112/69 117/67  Pulse:  95 (!) 56 84  Resp:  19    Temp:  (!) 97.5 F (36.4 C) 97.7 F (36.5 C) 98.7 F (37.1 C)  TempSrc:  Oral Oral Oral  SpO2:  100% 96% 98%  Weight:    67.3 kg  Height:        General: Pt is alert, awake, not in acute distress Cardiovascular: IIRR, S1/S2 +, no rubs, no gallops Respiratory: CTA bilaterally, no wheezing, no rhonchi Abdominal: Soft, NT, ND, bowel sounds + Extremities: no edema, no cyanosis   Discharge Instructions  Discharge Instructions    Diet - low sodium heart healthy   Complete by: As directed    Increase activity slowly   Complete by: As directed      Allergies as of 12/08/2019   No Known Allergies     Medication List    STOP taking these medications   amLODipine 5 MG tablet Commonly known as: NORVASC   aspirin EC 81 MG tablet   atenolol 50 MG tablet Commonly known as: TENORMIN   hydrochlorothiazide 12.5 MG tablet Commonly known as: HYDRODIURIL     TAKE these medications   acetaminophen 500 MG tablet Commonly known as: TYLENOL Take 1,500 mg by mouth every 4 (four) hours. What changed: Another medication with the same name was added. Make sure you understand  how and when to take each.   acetaminophen 325 MG tablet Commonly known as: TYLENOL Take 2 tablets (650 mg total) by mouth every 6 (six) hours as needed for mild pain or headache. What changed: You were already taking a medication with the same name, and this prescription was added. Make sure you understand how and when to take each.   apixaban 2.5 MG Tabs tablet Commonly known as: ELIQUIS Take 1 tablet (2.5 mg total) by mouth 2 (two) times daily. Notes to patient: 12/24   bisacodyl 5 MG EC tablet Commonly known as: DULCOLAX Take 2 tablets (10 mg total) by mouth daily.   famotidine 20 MG tablet Commonly known as: PEPCID Take 1 tablet (20 mg total)  by mouth 2 (two) times daily as needed for heartburn or indigestion.   Metoprolol Tartrate 75 MG Tabs Take 150 mg by mouth 2 (two) times daily.   polyethylene glycol 17 g packet Commonly known as: MIRALAX / GLYCOLAX Take 17 g by mouth 2 (two) times daily.   QUEtiapine 25 MG tablet Commonly known as: SEROQUEL Take 25 mg by mouth at bedtime.   senna-docusate 8.6-50 MG tablet Commonly known as: Senokot-S Take 3 tablets by mouth 2 (two) times daily.      Follow-up Information    Newman Nip, NP Follow up on 12/15/2019.   Specialties: Nurse Practitioner, Cardiology Why: at 1030 AM call first to find out where to park  Contact information: [address] [place] [address] [phone]          No Known Allergies   Procedures/Studies:   2D echo 1. Left ventricular ejection fraction, by visual estimation, is 35 to 40%. The left ventricle has severely decreased function. There is no left ventricular hypertrophy. 2. The left ventricle demonstrates global hypokinesis. 3. Global right ventricle has normal systolic function.The right ventricular size is normal. No increase in right ventricular wall thickness. 4. Left atrial size was moderately dilated. 5. Right atrial size was normal. 6. The mitral valve is normal in structure. Mild mitral valve regurgitation. No evidence of mitral stenosis. 7. The tricuspid valve is normal in structure. Tricuspid valve regurgitation moderate. 8. The aortic valve has an indeterminant number of cusps. Aortic valve regurgitation is mild. No evidence of aortic valve sclerosis or stenosis. 9. There is severe calcifcation of the aortic valve. 10. There is severe thickening of the aortic valve. 11. Pulmonic regurgitation is mild. 12. The pulmonic valve was normal in structure. Pulmonic valve regurgitation is mild. 13. Mildly elevated pulmonary artery systolic pressure. 14. The tricuspid regurgitant velocity is 2.77 m/s, and with an  assumed right atrial pressure of 3 mmHg, the estimated right ventricular systolic pressure is mildly elevated at 33.6 mmHg. 15. The inferior vena cava is normal in size with greater than 50% respiratory variability, suggesting right atrial pressure of 3 mmHg.  DG Chest Port 1 View  Addendum Date: 12/05/2019   ADDENDUM REPORT: 12/05/2019 09:35 ADDENDUM: Stable compression of a midthoracic vertebral body. Electronically Signed   By: Bretta Bang III M.D.   On: 12/05/2019 09:35   Result Date: 12/05/2019 CLINICAL DATA:  Congestive heart failure EXAM: PORTABLE CHEST 1 VIEW COMPARISON:  December 04, 2019 FINDINGS: There is persistent cardiomegaly with pulmonary vascularity within normal limits. There is ill-defined opacity in the left base, stable. There is a rather minimal left pleural effusion. There is persistent bilateral apical pleural thickening. The interstitium is thickened with questionable mild degree of interstitial edema. There is a  calcified granuloma in the left lower lobe. There is aortic atherosclerosis.  No adenopathy. IMPRESSION: Stable cardiomegaly with pulmonary vascularity within normal limits. Ill-defined opacity left base appears stable. There may be scarring in this area. A degree of superimposed pneumonia in this area is question. There is a minimal left pleural effusion. Suspect trace interstitial edema. Calcified granuloma left lower lobe, stable. No new opacity evident. Aortic Atherosclerosis (ICD10-I70.0). Electronically Signed: By: Bretta Bang III M.D. On: 12/05/2019 07:58   DG Chest Port 1 View  Result Date: 12/04/2019 CLINICAL DATA:  Chest pain with exertional shortness of breath and difficulty urinating for 2 weeks. History of atrial fibrillation. EXAM: PORTABLE CHEST 1 VIEW COMPARISON:  Chest radiographs 06/17/2017. Abdominopelvic CT 10/23/2018. FINDINGS: 1429 hours. Two views obtained. The heart size and mediastinal contours are stable with aortic  atherosclerosis. Left-greater-than-right biapical scarring appears stable. The pleural effusions demonstrated on prior abdominal CT have improved, although there is a possible small residual/recurrent left pleural effusion. There is retrocardiac opacity at the left lung base which appears improved compared with the prior chest radiographs. This could reflect scarring, atelectasis or an infiltrate. The pulmonary vasculature is somewhat indistinct without overt pulmonary edema. A midthoracic compression deformity appears somewhat progressive compared with prior radiographs. IMPRESSION: 1. Vascular congestion without overt pulmonary edema. 2. Left basilar pleuroparenchymal opacities appear improved compared with prior studies and may be chronic versus recurrent airspace disease and effusion. Radiographic follow up recommended. Electronically Signed   By: Carey Bullocks M.D.   On: 12/04/2019 14:46   DG Abd Portable 2V  Result Date: 12/07/2019 CLINICAL DATA:  85 year old male with abdominal pain. EXAM: PORTABLE ABDOMEN - 2 VIEW COMPARISON:  None. FINDINGS: There is no bowel dilatation or evidence of obstruction. No definite free air identified. Atherosclerotic calcification of the aorta. There is osteopenia with degenerative changes of the spine. No acute osseous pathology. IMPRESSION: No bowel dilatation or evidence of obstruction. Electronically Signed   By: Elgie Collard M.D.   On: 12/07/2019 18:16   ECHOCARDIOGRAM COMPLETE  Result Date: 12/05/2019   ECHOCARDIOGRAM REPORT   Patient Name:   COURTNEY FENLON Date of Exam: 12/05/2019 Medical Rec #:  [id number]        Height:       69.0 in Accession #:    [id number]       Weight:       150.1 lb Date of Birth:  09-23-1922         BSA:          1.83 m Patient Age:    89 years         BP:           131/90 mmHg Patient Gender: M                HR:           102 bpm. Exam Location:  Inpatient Procedure: 2D Echo Indications:    Atrial Fibrillation 427.31 / I48.91   History:        Patient has no prior history of Echocardiogram examinations.                 Risk Factors:Hypertension. Acute renal failure.  Sonographer:    Leeroy Bock Turrentine Referring Phys: [id number] Emeline General IMPRESSIONS  1. Left ventricular ejection fraction, by visual estimation, is 35 to 40%. The left ventricle has severely decreased function. There is no left ventricular hypertrophy.  2. The left ventricle demonstrates global hypokinesis.  3.  Global right ventricle has normal systolic function.The right ventricular size is normal. No increase in right ventricular wall thickness.  4. Left atrial size was moderately dilated.  5. Right atrial size was normal.  6. The mitral valve is normal in structure. Mild mitral valve regurgitation. No evidence of mitral stenosis.  7. The tricuspid valve is normal in structure. Tricuspid valve regurgitation moderate.  8. The aortic valve has an indeterminant number of cusps. Aortic valve regurgitation is mild. No evidence of aortic valve sclerosis or stenosis.  9. There is severe calcifcation of the aortic valve. 10. There is severe thickening of the aortic valve. 11. Pulmonic regurgitation is mild. 12. The pulmonic valve was normal in structure. Pulmonic valve regurgitation is mild. 13. Mildly elevated pulmonary artery systolic pressure. 14. The tricuspid regurgitant velocity is 2.77 m/s, and with an assumed right atrial pressure of 3 mmHg, the estimated right ventricular systolic pressure is mildly elevated at 33.6 mmHg. 15. The inferior vena cava is normal in size with greater than 50% respiratory variability, suggesting right atrial pressure of 3 mmHg. FINDINGS  Left Ventricle: Left ventricular ejection fraction, by visual estimation, is 35 to 40%. The left ventricle has severely decreased function. The left ventricle demonstrates global hypokinesis. There is no left ventricular hypertrophy. Normal left atrial pressure. Right Ventricle: The right ventricular size is  normal. No increase in right ventricular wall thickness. Global RV systolic function is has normal systolic function. The tricuspid regurgitant velocity is 2.77 m/s, and with an assumed right atrial pressure  of 3 mmHg, the estimated right ventricular systolic pressure is mildly elevated at 33.6 mmHg. Left Atrium: Left atrial size was moderately dilated. Right Atrium: Right atrial size was normal in size Pericardium: There is no evidence of pericardial effusion. Mitral Valve: The mitral valve is normal in structure. Mild mitral valve regurgitation. No evidence of mitral valve stenosis by observation. Tricuspid Valve: The tricuspid valve is normal in structure. Tricuspid valve regurgitation moderate. Aortic Valve: The aortic valve has an indeterminant number of cusps. . There is severe thickening and severe calcifcation of the aortic valve. Aortic valve regurgitation is mild. Aortic regurgitation PHT measures 292 msec. The aortic valve is structurally normal, with no evidence of sclerosis or stenosis. There is severe thickening of the aortic valve. There is severe calcifcation of the aortic valve. Aortic valve mean gradient measures 12.0 mmHg. Aortic valve peak gradient measures 19.8 mmHg. Aortic valve area, by VTI measures 0.73 cm. Pulmonic Valve: The pulmonic valve was normal in structure. Pulmonic valve regurgitation is mild. Pulmonic regurgitation is mild. Aorta: The aortic root, ascending aorta and aortic arch are all structurally normal, with no evidence of dilitation or obstruction. Venous: The inferior vena cava is normal in size with greater than 50% respiratory variability, suggesting right atrial pressure of 3 mmHg. IAS/Shunts: No atrial level shunt detected by color flow Doppler. There is no evidence of a patent foramen ovale. No ventricular septal defect is seen or detected. There is no evidence of an atrial septal defect.  LEFT VENTRICLE PLAX 2D LVIDd:         5.29 cm LVIDs:         4.21 cm LV PW:          1.02 cm LV IVS:        1.03 cm LVOT diam:     1.90 cm LV SV:         56 ml LV SV Index:   30.61 LVOT Area:  2.84 cm  RIGHT VENTRICLE RV S prime:     6.85 cm/s TAPSE (M-mode): 1.3 cm LEFT ATRIUM              Index       RIGHT ATRIUM           Index LA diam:        5.00 cm  2.73 cm/m  RA Area:     26.10 cm LA Vol (A2C):   104.0 ml 56.87 ml/m RA Volume:   86.20 ml  47.14 ml/m LA Vol (A4C):   84.0 ml  45.93 ml/m LA Biplane Vol: 92.5 ml  50.58 ml/m  AORTIC VALVE AV Area (Vmax):    0.81 cm AV Area (Vmean):   0.76 cm AV Area (VTI):     0.73 cm AV Vmax:           222.25 cm/s AV Vmean:          165.250 cm/s AV VTI:            0.429 m AV Peak Grad:      19.8 mmHg AV Mean Grad:      12.0 mmHg LVOT Vmax:         63.30 cm/s LVOT Vmean:        44.100 cm/s LVOT VTI:          0.110 m LVOT/AV VTI ratio: 0.26 AI PHT:            292 msec  AORTA Ao Root diam: 2.80 cm MR Peak grad: 106.5 mmHg  TRICUSPID VALVE MR Mean grad: 64.0 mmHg   TR Peak grad:   30.6 mmHg MR Vmax:      516.00 cm/s TR Vmax:        295.00 cm/s MR Vmean:     380.0 cm/s                           SHUNTS                           Systemic VTI:  0.11 m                           Systemic Diam: 1.90 cm  Donato Schultz MD Electronically signed by Donato Schultz MD Signature Date/Time: 12/05/2019/1:10:52 PM    Final       The results of significant diagnostics from this hospitalization (including imaging, microbiology, ancillary and laboratory) are listed below for reference.     Microbiology: Recent Results (from the past 240 hour(s))  SARS CORONAVIRUS 2 (TAT 6-24 HRS) Nasopharyngeal Nasopharyngeal Swab     Status: None   Collection Time: 12/04/19  4:59 PM   Specimen: Nasopharyngeal Swab  Result Value Ref Range Status   SARS Coronavirus 2 NEGATIVE NEGATIVE Final    Comment: (NOTE) SARS-CoV-2 target nucleic acids are NOT DETECTED. The SARS-CoV-2 RNA is generally detectable in upper and lower respiratory specimens during the acute phase of infection.  Negative results do not preclude SARS-CoV-2 infection, do not rule out co-infections with other pathogens, and should not be used as the sole basis for treatment or other patient management decisions. Negative results must be combined with clinical observations, patient history, and epidemiological information. The expected result is Negative. Fact Sheet for Patients: HairSlick.no Fact Sheet for Healthcare Providers: quierodirigir.com This test is not yet approved or cleared by the  [place] [employer] and  has been authorized for detection and/or diagnosis of SARS-CoV-2 by FDA under an TEFL teacher (EUA). This EUA will remain  in effect (meaning this test can be used) for the duration of the COVID-19 declaration under Section 56 4(b)(1) of the Act, 21 U.S.C. section 360bbb-3(b)(1), unless the authorization is terminated or revoked sooner. Performed at [facility], [address], [place], [place] [address]      Labs: BNP (last 3 results) Recent Labs    12/04/19 1444  BNP 1,568.1*   Basic Metabolic Panel: Recent Labs  Lab 12/04/19 1444 12/06/19 0337  NA 135 136  K 4.7 4.5  CL 106 104  CO2 18* 23  GLUCOSE 106* 115*  BUN 37* 38*  CREATININE 2.10* 2.17*  CALCIUM 8.9 9.3   Liver Function Tests: No results for input(s): AST, ALT, ALKPHOS, BILITOT, PROT, ALBUMIN in the last 168 hours. No results for input(s): LIPASE, AMYLASE in the last 168 hours. No results for input(s): AMMONIA in the last 168 hours. CBC: Recent Labs  Lab 12/04/19 1444 12/06/19 0337  WBC 7.6 6.8  NEUTROABS 6.0  --   HGB 14.9 15.0  HCT 44.7 45.6  MCV 95.1 94.2  PLT 162 161   Cardiac Enzymes: No results for input(s): CKTOTAL, CKMB, CKMBINDEX, TROPONINI in the last 168 hours. BNP: Invalid input(s): POCBNP CBG: No results for input(s): GLUCAP in the last 168 hours. D-Dimer No results for input(s): DDIMER in the last 72  hours. Hgb A1c No results for input(s): HGBA1C in the last 72 hours. Lipid Profile No results for input(s): CHOL, HDL, LDLCALC, TRIG, CHOLHDL, LDLDIRECT in the last 72 hours. Thyroid function studies No results for input(s): TSH, T4TOTAL, T3FREE, THYROIDAB in the last 72 hours.  Invalid input(s): FREET3 Anemia work up No results for input(s): VITAMINB12, FOLATE, FERRITIN, TIBC, IRON, RETICCTPCT in the last 72 hours. Urinalysis    Component Value Date/Time   COLORURINE YELLOW 12/04/2019 1744   APPEARANCEUR CLEAR 12/04/2019 1744   LABSPEC 1.012 12/04/2019 1744   PHURINE 5.0 12/04/2019 1744   GLUCOSEU NEGATIVE 12/04/2019 1744   HGBUR NEGATIVE 12/04/2019 1744   BILIRUBINUR NEGATIVE 12/04/2019 1744   KETONESUR NEGATIVE 12/04/2019 1744   PROTEINUR 100 (A) 12/04/2019 1744   NITRITE NEGATIVE 12/04/2019 1744   LEUKOCYTESUR NEGATIVE 12/04/2019 1744   Sepsis Labs Invalid input(s): PROCALCITONIN,  WBC,  LACTICIDVEN Microbiology Recent Results (from the past 240 hour(s))  SARS CORONAVIRUS 2 (TAT 6-24 HRS) Nasopharyngeal Nasopharyngeal Swab     Status: None   Collection Time: 12/04/19  4:59 PM   Specimen: Nasopharyngeal Swab  Result Value Ref Range Status   SARS Coronavirus 2 NEGATIVE NEGATIVE Final    Comment: (NOTE) SARS-CoV-2 target nucleic acids are NOT DETECTED. The SARS-CoV-2 RNA is generally detectable in upper and lower respiratory specimens during the acute phase of infection. Negative results do not preclude SARS-CoV-2 infection, do not rule out co-infections with other pathogens, and should not be used as the sole basis for treatment or other patient management decisions. Negative results must be combined with clinical observations, patient history, and epidemiological information. The expected result is Negative. Fact Sheet for Patients: HairSlick.no Fact Sheet for Healthcare Providers: quierodirigir.com This test is  not yet approved or cleared by the [place] FDA and  has been authorized for detection and/or diagnosis of SARS-CoV-2 by FDA under an Emergency Use Authorization (EUA). This EUA will remain  in effect (meaning this test can be used) for the  duration of the COVID-19 declaration under Section 56 4(b)(1) of the Act, 21 U.S.C. section 360bbb-3(b)(1), unless the authorization is terminated or revoked sooner. Performed at [facility], [address] [place], [place] [address]      Time coordinating discharge in minutes: 65  SIGNED:   Calvert Cantor, MD  [facility] 12/11/2019, 2:17 PM Pager   If 7PM-7AM, please contact night-coverage [email or web address] Password [email or web address]

## 2019-12-15 ENCOUNTER — Ambulatory Visit (HOSPITAL_COMMUNITY): Payer: Medicare Other | Admitting: Nurse Practitioner

## 2019-12-16 ENCOUNTER — Inpatient Hospital Stay (HOSPITAL_COMMUNITY)
Admission: EM | Admit: 2019-12-16 | Discharge: 2020-01-16 | DRG: 951 | Disposition: E | Payer: Medicare Other | Attending: Internal Medicine | Admitting: Internal Medicine

## 2019-12-16 ENCOUNTER — Emergency Department (HOSPITAL_COMMUNITY): Payer: Medicare Other

## 2019-12-16 ENCOUNTER — Encounter (HOSPITAL_COMMUNITY): Payer: Self-pay | Admitting: Emergency Medicine

## 2019-12-16 ENCOUNTER — Other Ambulatory Visit: Payer: Self-pay

## 2019-12-16 DIAGNOSIS — Z515 Encounter for palliative care: Secondary | ICD-10-CM | POA: Diagnosis not present

## 2019-12-16 DIAGNOSIS — Z66 Do not resuscitate: Secondary | ICD-10-CM | POA: Diagnosis present

## 2019-12-16 DIAGNOSIS — I5022 Chronic systolic (congestive) heart failure: Secondary | ICD-10-CM | POA: Diagnosis present

## 2019-12-16 DIAGNOSIS — R531 Weakness: Secondary | ICD-10-CM

## 2019-12-16 DIAGNOSIS — R7989 Other specified abnormal findings of blood chemistry: Secondary | ICD-10-CM | POA: Diagnosis present

## 2019-12-16 DIAGNOSIS — I5023 Acute on chronic systolic (congestive) heart failure: Secondary | ICD-10-CM | POA: Diagnosis present

## 2019-12-16 DIAGNOSIS — N183 Chronic kidney disease, stage 3 unspecified: Secondary | ICD-10-CM | POA: Diagnosis present

## 2019-12-16 DIAGNOSIS — Z7189 Other specified counseling: Secondary | ICD-10-CM | POA: Diagnosis not present

## 2019-12-16 DIAGNOSIS — I130 Hypertensive heart and chronic kidney disease with heart failure and stage 1 through stage 4 chronic kidney disease, or unspecified chronic kidney disease: Secondary | ICD-10-CM | POA: Diagnosis present

## 2019-12-16 DIAGNOSIS — Z87891 Personal history of nicotine dependence: Secondary | ICD-10-CM | POA: Diagnosis not present

## 2019-12-16 DIAGNOSIS — E860 Dehydration: Secondary | ICD-10-CM | POA: Diagnosis present

## 2019-12-16 DIAGNOSIS — K7290 Hepatic failure, unspecified without coma: Secondary | ICD-10-CM | POA: Diagnosis present

## 2019-12-16 DIAGNOSIS — Z20822 Contact with and (suspected) exposure to covid-19: Secondary | ICD-10-CM | POA: Diagnosis present

## 2019-12-16 DIAGNOSIS — G92 Toxic encephalopathy: Secondary | ICD-10-CM | POA: Diagnosis not present

## 2019-12-16 DIAGNOSIS — H9190 Unspecified hearing loss, unspecified ear: Secondary | ICD-10-CM | POA: Diagnosis present

## 2019-12-16 DIAGNOSIS — N179 Acute kidney failure, unspecified: Secondary | ICD-10-CM | POA: Diagnosis present

## 2019-12-16 DIAGNOSIS — R0609 Other forms of dyspnea: Secondary | ICD-10-CM

## 2019-12-16 DIAGNOSIS — Z7901 Long term (current) use of anticoagulants: Secondary | ICD-10-CM

## 2019-12-16 DIAGNOSIS — K5900 Constipation, unspecified: Secondary | ICD-10-CM | POA: Diagnosis present

## 2019-12-16 DIAGNOSIS — I4821 Permanent atrial fibrillation: Secondary | ICD-10-CM | POA: Diagnosis present

## 2019-12-16 DIAGNOSIS — E875 Hyperkalemia: Secondary | ICD-10-CM | POA: Diagnosis present

## 2019-12-16 DIAGNOSIS — R54 Age-related physical debility: Secondary | ICD-10-CM | POA: Diagnosis present

## 2019-12-16 DIAGNOSIS — R778 Other specified abnormalities of plasma proteins: Secondary | ICD-10-CM

## 2019-12-16 DIAGNOSIS — B029 Zoster without complications: Secondary | ICD-10-CM | POA: Diagnosis present

## 2019-12-16 DIAGNOSIS — Z79899 Other long term (current) drug therapy: Secondary | ICD-10-CM

## 2019-12-16 DIAGNOSIS — N1832 Chronic kidney disease, stage 3b: Secondary | ICD-10-CM | POA: Diagnosis present

## 2019-12-16 DIAGNOSIS — K7200 Acute and subacute hepatic failure without coma: Secondary | ICD-10-CM

## 2019-12-16 DIAGNOSIS — R0600 Dyspnea, unspecified: Secondary | ICD-10-CM

## 2019-12-16 LAB — I-STAT CHEM 8, ED
BUN: 78 mg/dL — ABNORMAL HIGH (ref 8–23)
Calcium, Ion: 1.02 mmol/L — ABNORMAL LOW (ref 1.15–1.40)
Chloride: 109 mmol/L (ref 98–111)
Creatinine, Ser: 4 mg/dL — ABNORMAL HIGH (ref 0.61–1.24)
Glucose, Bld: 74 mg/dL (ref 70–99)
HCT: 53 % — ABNORMAL HIGH (ref 39.0–52.0)
Hemoglobin: 18 g/dL — ABNORMAL HIGH (ref 13.0–17.0)
Potassium: 5.8 mmol/L — ABNORMAL HIGH (ref 3.5–5.1)
Sodium: 134 mmol/L — ABNORMAL LOW (ref 135–145)
TCO2: 17 mmol/L — ABNORMAL LOW (ref 22–32)

## 2019-12-16 LAB — COMPREHENSIVE METABOLIC PANEL
ALT: 696 U/L — ABNORMAL HIGH (ref 0–44)
AST: 706 U/L — ABNORMAL HIGH (ref 15–41)
Albumin: 3.5 g/dL (ref 3.5–5.0)
Alkaline Phosphatase: 100 U/L (ref 38–126)
Anion gap: 15 (ref 5–15)
BUN: 86 mg/dL — ABNORMAL HIGH (ref 8–23)
CO2: 17 mmol/L — ABNORMAL LOW (ref 22–32)
Calcium: 9.2 mg/dL (ref 8.9–10.3)
Chloride: 104 mmol/L (ref 98–111)
Creatinine, Ser: 3.81 mg/dL — ABNORMAL HIGH (ref 0.61–1.24)
GFR calc Af Amer: 15 mL/min — ABNORMAL LOW (ref >60)
GFR calc non Af Amer: 13 mL/min — ABNORMAL LOW (ref >60)
Glucose, Bld: 83 mg/dL (ref 70–99)
Potassium: 6.5 mmol/L (ref 3.5–5.1)
Sodium: 136 mmol/L (ref 135–145)
Total Bilirubin: 2.5 mg/dL — ABNORMAL HIGH (ref 0.3–1.2)
Total Protein: 6.1 g/dL — ABNORMAL LOW (ref 6.5–8.1)

## 2019-12-16 LAB — CBC WITH DIFFERENTIAL/PLATELET
Abs Immature Granulocytes: 0.11 10*3/uL — ABNORMAL HIGH (ref 0.00–0.07)
Basophils Absolute: 0 10*3/uL (ref 0.0–0.1)
Basophils Relative: 0 %
Eosinophils Absolute: 0 10*3/uL (ref 0.0–0.5)
Eosinophils Relative: 0 %
HCT: 53.2 % — ABNORMAL HIGH (ref 39.0–52.0)
Hemoglobin: 17.2 g/dL — ABNORMAL HIGH (ref 13.0–17.0)
Immature Granulocytes: 1 %
Lymphocytes Relative: 15 %
Lymphs Abs: 1.3 10*3/uL (ref 0.7–4.0)
MCH: 31.4 pg (ref 26.0–34.0)
MCHC: 32.3 g/dL (ref 30.0–36.0)
MCV: 97.3 fL (ref 80.0–100.0)
Monocytes Absolute: 0.7 10*3/uL (ref 0.1–1.0)
Monocytes Relative: 8 %
Neutro Abs: 6.5 10*3/uL (ref 1.7–7.7)
Neutrophils Relative %: 76 %
Platelets: 178 10*3/uL (ref 150–400)
RBC: 5.47 MIL/uL (ref 4.22–5.81)
RDW: 16.7 % — ABNORMAL HIGH (ref 11.5–15.5)
WBC: 8.7 10*3/uL (ref 4.0–10.5)
nRBC: 1.8 % — ABNORMAL HIGH (ref 0.0–0.2)

## 2019-12-16 LAB — URINALYSIS, ROUTINE W REFLEX MICROSCOPIC
Bacteria, UA: NONE SEEN
Bilirubin Urine: NEGATIVE
Glucose, UA: NEGATIVE mg/dL
Hgb urine dipstick: NEGATIVE
Ketones, ur: 5 mg/dL — AB
Leukocytes,Ua: NEGATIVE
Nitrite: NEGATIVE
Protein, ur: 100 mg/dL — AB
Specific Gravity, Urine: 1.023 (ref 1.005–1.030)
pH: 5 (ref 5.0–8.0)

## 2019-12-16 LAB — TROPONIN I (HIGH SENSITIVITY)
Troponin I (High Sensitivity): 65 ng/L — ABNORMAL HIGH (ref <18)
Troponin I (High Sensitivity): 69 ng/L — ABNORMAL HIGH (ref <18)

## 2019-12-16 LAB — LIPASE, BLOOD: Lipase: 33 U/L (ref 11–51)

## 2019-12-16 LAB — BRAIN NATRIURETIC PEPTIDE: B Natriuretic Peptide: 3011.8 pg/mL — ABNORMAL HIGH (ref 0.0–100.0)

## 2019-12-16 LAB — POC SARS CORONAVIRUS 2 AG -  ED: SARS Coronavirus 2 Ag: NEGATIVE

## 2019-12-16 MED ORDER — ONDANSETRON HCL 4 MG PO TABS: 4.0000 mg | ORAL_TABLET | Freq: Four times a day (QID) | ORAL | Status: DC | PRN

## 2019-12-16 MED ORDER — SODIUM BICARBONATE 8.4 % IV SOLN
50.0000 meq | Freq: Once | INTRAVENOUS | Status: AC
  Administered 2019-12-16: 16:00:00 50 meq via INTRAVENOUS
  Filled 2019-12-16: qty 50

## 2019-12-16 MED ORDER — LORAZEPAM 2 MG/ML IJ SOLN: 1.0000 mg | INTRAMUSCULAR | Status: DC | PRN

## 2019-12-16 MED ORDER — SODIUM CHLORIDE 0.9 % IV SOLN: 1.0000 g | Freq: Once | INTRAVENOUS | Status: DC

## 2019-12-16 MED ORDER — DILTIAZEM HCL 25 MG/5ML IV SOLN
10.0000 mg | Freq: Once | INTRAVENOUS | Status: AC
  Administered 2019-12-16: 14:00:00 10 mg via INTRAVENOUS
  Filled 2019-12-16: qty 5

## 2019-12-16 MED ORDER — FENTANYL CITRATE (PF) 100 MCG/2ML IJ SOLN
25.0000 ug | Freq: Once | INTRAMUSCULAR | Status: AC
  Administered 2019-12-16: 16:00:00 25 ug via INTRAVENOUS
  Filled 2019-12-16: qty 2

## 2019-12-16 MED ORDER — SODIUM ZIRCONIUM CYCLOSILICATE 10 G PO PACK
10.0000 g | PACK | Freq: Once | ORAL | Status: AC
  Administered 2019-12-16: 10 g via ORAL
  Filled 2019-12-16: qty 1

## 2019-12-16 MED ORDER — QUETIAPINE FUMARATE 25 MG PO TABS
25.0000 mg | ORAL_TABLET | Freq: Every day | ORAL | Status: DC
  Administered 2019-12-16 – 2019-12-17 (×2): 25 mg via ORAL
  Filled 2019-12-16 (×2): qty 1

## 2019-12-16 MED ORDER — ONDANSETRON HCL 4 MG/2ML IJ SOLN: 4.0000 mg | Freq: Four times a day (QID) | INTRAMUSCULAR | Status: DC | PRN

## 2019-12-16 MED ORDER — DEXTROSE 50 % IV SOLN
50.0000 mL | Freq: Once | INTRAVENOUS | Status: AC
  Administered 2019-12-16: 16:00:00 50 mL via INTRAVENOUS
  Filled 2019-12-16: qty 50

## 2019-12-16 MED ORDER — MORPHINE SULFATE (PF) 2 MG/ML IV SOLN
2.0000 mg | INTRAVENOUS | Status: DC | PRN
  Administered 2019-12-17 – 2019-12-19 (×3): 2 mg via INTRAVENOUS
  Filled 2019-12-16 (×3): qty 1

## 2019-12-16 MED ORDER — CALCIUM GLUCONATE-NACL 1-0.675 GM/50ML-% IV SOLN
1.0000 g | Freq: Once | INTRAVENOUS | Status: AC
  Administered 2019-12-16: 16:00:00 1000 mg via INTRAVENOUS
  Filled 2019-12-16: qty 50

## 2019-12-16 MED ORDER — INSULIN ASPART 100 UNIT/ML ~~LOC~~ SOLN
2.0000 [IU] | Freq: Once | SUBCUTANEOUS | Status: AC
  Administered 2019-12-16: 16:00:00 2 [IU] via SUBCUTANEOUS
  Filled 2019-12-16: qty 0.02

## 2019-12-16 NOTE — ED Triage Notes (Signed)
Patient is from home and presents with multiple complaints. Since his recent discharge he has had abdominal pain, shortness of breath, constipation ans weakness. He has become so weak he can not walk to the bathroom. He mostly just lays in bed.    EMS vitals: 142/90 BP 66 HR 14 Resp Rate

## 2019-12-16 NOTE — H&P (Signed)
.  History and Physical    [place] SAY:[id number] DOB: 04-Jan-1922 DOA: 01/05/2020  PCP: Patient, [name]  Patient coming from: Home   Chief Complaint: Dyspnea, weakness  HPI: Clayton Hoffman is a 86 y.o. male with medical history significant of heart failure. Presents with increasing dyspnea and fatigue. Reports that his symptoms have been ongoing for more than a month and it's to the point that he's tired of it all. He knows of no allievating or aggravating factors. ED discussion with family resulted in patient wishing for comfort care measures. [facility] was consulted for admission.  ED Course: Found to have elevated BNP, Scr, LFTs. [facility] called for admission.   Review of Systems: Denies CP, ab pain, N, V. Reports dyspnea, fatigue, DOE. Remainder of 10 point review of systems is otherwise negative for all not mentioned in HPI.    Past Medical History:  Diagnosis Date  . Atrial fibrillation (HCC)   . HOH (hard of hearing)   . Hypertension     Past Surgical History:  Procedure Laterality Date  . APPENDECTOMY    . LUNG BIOPSY       reports that he has quit smoking. His smoking use included cigarettes. He quit after 40.00 years of use. He has never used smokeless tobacco. He reports that he does not drink alcohol or use drugs.  No Known Allergies  History reviewed. No pertinent family history.  Prior to Admission medications   Medication Sig Start Date End Date Taking? Authorizing Provider  acetaminophen (TYLENOL) 500 MG tablet Take 1,500 mg by mouth every 4 (four) hours.   Yes [provider]  apixaban (ELIQUIS) 2.5 MG TABS tablet Take 1 tablet (2.5 mg total) by mouth 2 (two) times daily. 12/08/19  Yes Calvert Cantor, MD  bisacodyl (DULCOLAX) 5 MG EC tablet Take 2 tablets (10 mg total) by mouth daily. 10/25/18  Yes Alwyn Ren, MD  famotidine (PEPCID) 20 MG tablet Take 1 tablet (20 mg total) by mouth 2 (two) times daily as needed for heartburn or  indigestion. 12/08/19  Yes Calvert Cantor, MD  HYDROcodone-acetaminophen (NORCO/VICODIN) 5-325 MG tablet Take 1 tablet by mouth every 6 (six) hours as needed for pain. 12/12/19  Yes [provider]  Metoprolol Tartrate 75 MG TABS Take 150 mg by mouth 2 (two) times daily. Patient taking differently: Take 112.5 mg by mouth 2 (two) times daily.  12/08/19  Yes Calvert Cantor, MD  polyethylene glycol (MIRALAX / GLYCOLAX) packet Take 17 g by mouth 2 (two) times daily. Patient taking differently: Take 17 g by mouth daily.  10/24/18  Yes Alwyn Ren, MD  QUEtiapine (SEROQUEL) 25 MG tablet Take 25 mg by mouth at bedtime. 09/27/18  Yes [provider]  senna-docusate (SENOKOT-S) 8.6-50 MG tablet Take 3 tablets by mouth 2 (two) times daily. 10/24/18  Yes Alwyn Ren, MD    Physical Exam: Vitals:   12/20/2019 1258 12/28/2019 1430 12/30/2019 1439 01/01/2020 1714  BP:  (!) 142/92  (!) 146/93  Pulse:   95 74  Resp:  17  18  Temp:      TempSrc:      SpO2: 100%  92% 100%    Constitutional: 86 y.o. male NAD, calm, comfortable Vitals:   01/01/2020 1258 12/31/2019 1430 01/14/2020 1439 01/11/2020 1714  BP:  (!) 142/92  (!) 146/93  Pulse:   95 74  Resp:  17  18  Temp:      TempSrc:  SpO2: 100%  92% 100%   General: 86 y.o. ill appearing male resting in bed Eyes: PERRL, normal sclera ENMT: Nares patent w/o discharge, orophaynx clear, dentition normal, ears w/o discharge/lesions/ulcers Cardiovascular: RRR, +S1, S2, no m/g/r, equal pulses throughout Respiratory: scattered soft wheeze, decreased at bases, somewhat breathless with speech GI: BS+, NDNT, no masses noted, no organomegaly noted MSK: No c/c; trace BLE pedal edema Neuro: A&O name, place, hard of hearing but follows commands Psyc: Appropriate interaction and affect, calm/cooperative  Labs on Admission: I have personally reviewed following labs and imaging studies  CBC: Recent Labs  Lab 01/05/2020 1350 01/15/2020 1355    WBC 8.7  --   NEUTROABS 6.5  --   HGB 17.2* 18.0*  HCT 53.2* 53.0*  MCV 97.3  --   PLT 178  --    Basic Metabolic Panel: Recent Labs  Lab 01/14/2020 1350 01/03/2020 1355  NA 136 134*  K 6.5* 5.8*  CL 104 109  CO2 17*  --   GLUCOSE 83 74  BUN 86* 78*  CREATININE 3.81* 4.00*  CALCIUM 9.2  --    GFR: Estimated Creatinine Clearance: 10.5 mL/min (A) (by C-G formula based on SCr of 4 mg/dL (H)). Liver Function Tests: Recent Labs  Lab 12/31/2019 1350  AST 706*  ALT 696*  ALKPHOS 100  BILITOT 2.5*  PROT 6.1*  ALBUMIN 3.5   Recent Labs  Lab 12/27/2019 1350  LIPASE 33   No results for input(s): AMMONIA in the last 168 hours. Coagulation Profile: No results for input(s): INR, PROTIME in the last 168 hours. Cardiac Enzymes: No results for input(s): CKTOTAL, CKMB, CKMBINDEX, TROPONINI in the last 168 hours. BNP (last 3 results) No results for input(s): PROBNP in the last 8760 hours. HbA1C: No results for input(s): HGBA1C in the last 72 hours. CBG: No results for input(s): GLUCAP in the last 168 hours. Lipid Profile: No results for input(s): CHOL, HDL, LDLCALC, TRIG, CHOLHDL, LDLDIRECT in the last 72 hours. Thyroid Function Tests: No results for input(s): TSH, T4TOTAL, FREET4, T3FREE, THYROIDAB in the last 72 hours. Anemia Panel: No results for input(s): VITAMINB12, FOLATE, FERRITIN, TIBC, IRON, RETICCTPCT in the last 72 hours. Urine analysis:    Component Value Date/Time   COLORURINE YELLOW 12/04/2019 1744   APPEARANCEUR CLEAR 12/04/2019 1744   LABSPEC 1.012 12/04/2019 1744   PHURINE 5.0 12/04/2019 1744   GLUCOSEU NEGATIVE 12/04/2019 1744   HGBUR NEGATIVE 12/04/2019 1744   BILIRUBINUR NEGATIVE 12/04/2019 1744   KETONESUR NEGATIVE 12/04/2019 1744   PROTEINUR 100 (A) 12/04/2019 1744   NITRITE NEGATIVE 12/04/2019 1744   LEUKOCYTESUR NEGATIVE 12/04/2019 1744    Radiological Exams on Admission: CT Head Wo Contrast  Result Date: 12/24/2019 CLINICAL DATA:  Weakness.   Failure to thrive. EXAM: CT HEAD WITHOUT CONTRAST TECHNIQUE: Contiguous axial images were obtained from the base of the skull through the vertex without intravenous contrast. COMPARISON:  None. FINDINGS: Brain: No evidence of acute infarction, hemorrhage, hydrocephalus, extra-axial collection or mass lesion/mass effect. Global atrophy. Periventricular white matter hypodensity likely representing chronic small vessel ischemic change. Vascular: Calcification in the bilateral vertebral arteries and carotid siphons. No hyperdense vessel. Skull: Normal. Negative for fracture or focal lesion. Sinuses/Orbits: No acute finding. Other: None. IMPRESSION: No acute intracranial abnormality. Atrophy and chronic small vessel ischemic change. Electronically Signed   By: Emmaline Kluver M.D.   On: 01/03/2020 14:41   DG Chest Port 1 View  Result Date: 12/25/2019 CLINICAL DATA:  Shortness of breath EXAM: PORTABLE CHEST  1 VIEW COMPARISON:  12/05/2019 FINDINGS: The heart size and mediastinal contours are stable. Round 11 mm nodular density projects over the inferolateral aspect the right lung field, possibly representing a nipple shadow. Otherwise, no new airspace consolidation. No pleural effusion or pneumothorax. Compression deformities within the midthoracic spine (approximately T7 and T8) are grossly stable from prior. IMPRESSION: 1. 11 mm rounded nodular density projects over the inferolateral aspect of the right lung field, possibly representing a nipple shadow. Repeat radiograph with nipple markers could be performed to confirm. 2. Otherwise, no acute cardiopulmonary findings. 3. Stable compression deformities within the midthoracic spine. Electronically Signed   By: Davina Poke D.O.   On: 01/08/2020 13:35   CT Renal Stone Study  Result Date: 01/10/2020 CLINICAL DATA:  Diffuse abdominal pain and constipation. Left-sided hip pain. History of chronic renal insufficiency. EXAM: CT ABDOMEN AND PELVIS WITHOUT CONTRAST  TECHNIQUE: Multidetector CT imaging of the abdomen and pelvis was performed following the standard protocol without IV contrast. COMPARISON:  CT abdomen pelvis-10/23/2018 FINDINGS: The lack of intravenous contrast limits the ability to evaluate solid abdominal organs. Lower chest: Limited visualization of the lower thorax demonstrates small bilateral pleural effusions with associated bibasilar heterogeneous/consolidative opacities, similar to abdominal CT performed 10/23/2018. Cardiomegaly. Coronary artery calcifications. Calcifications within the aortic valve leaflets. No pericardial effusion. Hepatobiliary: Normal hepatic contour. Normal noncontrast appearance of the gallbladder given degree of distention. No radiopaque gallstones. No ascites. Pancreas: The pancreas appears atrophic. Spleen: Dystrophic calcifications about the anterior aspect of the spleen, unchanged. Note is again made of small splenule. Adrenals/Urinary Tract: Bilateral kidneys appear atrophic compatible with provided history of chronic kidney disease. Previously characterized approximately 1.2 cm right-sided parapelvic cyst appears morphologically unchanged compared to the 10/2018 examination. Exophytic hypoattenuating lesion arising from the posterosuperior aspect the left kidney is incompletely characterized on this noncontrast examination though also favored to represent a renal cyst. Linear calcification about the anterior aspect the left renal pelvis is favored to be vascular in etiology. No definite renal stones. No renal stones are seen along the expected course of either ureter or the urinary bladder. There is a minimal amount of likely age and body habitus related symmetric bilateral perinephric stranding. No urinary obstruction. Normal appearance of the urinary bladder given degree of distention. Normal appearance the bilateral adrenal glands. Stomach/Bowel: Large bilateral inguinal hernias, the left side of which contains a portion  of nondilated descending sigmoid colon and the right side of which contains moderate to large segment of the terminal ileum, similar to abdominal CT performed 10/23/2018 and again not resulting in enteric obstruction. Normal appearance of the terminal ileum. The appendix is not visualized compatible with provided operative history. No pneumoperitoneum, pneumatosis or portal venous gas. Vascular/Lymphatic: Moderate to large amount of atherosclerotic plaque within normal caliber abdominal aorta. No bulky retroperitoneal, mesenteric, pelvic or inguinal lymphadenopathy on this noncontrast examination. Reproductive: Borderline prostatomegaly. There is a small amount of fluid in the pelvic cul-de-sac. Other: Large bilateral inguinal hernias as detailed above. Minimal amount of subcutaneous edema about the midline of the low back. Musculoskeletal: No acute or aggressive osseous abnormalities. Moderate (approximately 50%) compression deformities involving the L4 and L5 vertebral bodies appears similar to the 10/2018 examination. IMPRESSION: 1. No acute findings. Specifically, no evidence of nephrolithiasis or urinary obstruction. 2. Redemonstrated large bilateral inguinal hernias, the left-sided hernia containing a portion of the descending and sigmoid colon and the right-side containing a moderate to large segment of the terminal ileum, neither of which results in enteric  obstruction and are unchanged compared to the 10/2018 examination. 3. Unremarkable colonic stool burden. 4. Old compression deformities involving the L4 and L5 vertebral bodies, similar to the 10/2018 examination. 5. Cardiomegaly with small bilateral effusions, unchanged. 6. Coronary artery calcifications. Aortic Atherosclerosis (ICD10-I70.0). Electronically Signed   By: Simonne Come M.D.   On: 12/29/2019 14:54    Assessment/Plan Active Problems:   Elevated troponin   AKI (acute kidney injury) (HCC)   CKD (chronic kidney disease) stage 3, GFR 30-59  ml/min   Acute on chronic systolic CHF (congestive heart failure) (HCC)   Elevated LFTs   Palliative care encounter   DOE (dyspnea on exertion)   Generalized weakness   Acute on chronic systolic HF DOE Elevated troponin Palliative care status/encounter     - BNP is 3000+     - CXR is actually ok     - I had a long discussion with pt and POA (his dtr); they want to pursue comfort care measures; preferably in-hospital hospice     - have consulted PC     - no lab draws     - will have PRN morphine and ativan     - O2 as necessary  AKI on CKD 3b Elevated LFTs     - family has elected comfort care; no further lab draws  DVT prophylaxis: None  Code Status: DNR  Family Communication: With dtr by phone  Disposition Plan: TBD  Consults called: PC  Admission status: Inpatient. Need for IV comfort measures.    Teddy Spike DO [facility]  If 7PM-7AM, please contact night-coverage [email or web address]  12/18/2019, 5:31 PM

## 2019-12-16 NOTE — ED Provider Notes (Signed)
[facility] DEPT Provider Note   CSN: [id number] Arrival date & time: 01/05/2020  1225     History Chief Complaint  Patient presents with  . Weakness  . Shortness of Breath  . Abdominal Pain  . Constipation    Clayton Hoffman is a 86 y.o. male hx of afib on eliquis, HTN, CKD, CHF who presented with shortness of breath, weakness, abdominal pain, constipation.  Patient was admitted to the hospital is found to be in new onset atrial fibrillation and CHF.  Patient was discharged home several days ago with Eliquis and Cardizem.  Patient states that he has not been feeling well since then.  Patient is very hard of hearing and difficult to get a straightforward history.  Patient states that he has been weak and tired.  Also has some right-sided abdominal pain as well.  He states that it is hard for him to get up and walk due to his weakness.  He has been laying in bed after discharge.  The history is provided by the patient.       Past Medical History:  Diagnosis Date  . Atrial fibrillation ([place])   . HOH (hard of hearing)   . Hypertension     Patient Active Problem List   Diagnosis Date Noted  . CKD (chronic kidney disease) stage 3, GFR 30-59 ml/min 12/06/2019  . Chronic systolic CHF (congestive heart failure) ([place]) 12/06/2019  . Atrial fibrillation with RVR ([place]) 12/04/2019  . Chronic kidney disease   . LGI bleed 10/23/2018  . HTN (hypertension) 10/23/2018  . Rectal bleeding   . Acute respiratory failure with hypoxia ([place])   . AKI (acute kidney injury) ([place])   . Community acquired pneumonia   . Sepsis due to pneumonia ([place]) 06/17/2017  . Acute on chronic respiratory failure with hypoxia ([place]) 06/17/2017  . Urinary tract infection with hematuria 06/17/2017  . Hyponatremia 06/17/2017  . Elevated troponin 06/17/2017  . Acute renal failure (ARF) ([place]) 06/17/2017    Past Surgical History:  Procedure Laterality Date  . APPENDECTOMY    . LUNG  BIOPSY         History reviewed. No pertinent family history.  Social History   Tobacco Use  . Smoking status: Former Smoker    Years: 40.00    Types: Cigarettes  . Smokeless tobacco: Never Used  Substance Use Topics  . Alcohol use: No  . Drug use: No    Home Medications Prior to Admission medications   Medication Sig Start Date End Date Taking? Authorizing Provider  acetaminophen (TYLENOL) 500 MG tablet Take 1,500 mg by mouth every 4 (four) hours.   Yes [provider]  apixaban (ELIQUIS) 2.5 MG TABS tablet Take 1 tablet (2.5 mg total) by mouth 2 (two) times daily. 12/08/19  Yes Debbe Odea, MD  bisacodyl (DULCOLAX) 5 MG EC tablet Take 2 tablets (10 mg total) by mouth daily. 10/25/18  Yes Georgette Shell, MD  famotidine (PEPCID) 20 MG tablet Take 1 tablet (20 mg total) by mouth 2 (two) times daily as needed for heartburn or indigestion. 12/08/19  Yes Debbe Odea, MD  HYDROcodone-acetaminophen (NORCO/VICODIN) 5-325 MG tablet Take 1 tablet by mouth every 6 (six) hours as needed for pain. 12/12/19  Yes [provider]  Metoprolol Tartrate 75 MG TABS Take 150 mg by mouth 2 (two) times daily. Patient taking differently: Take 112.5 mg by mouth 2 (two) times daily.  12/08/19  Yes Debbe Odea, MD  polyethylene glycol (  MIRALAX / GLYCOLAX) packet Take 17 g by mouth 2 (two) times daily. Patient taking differently: Take 17 g by mouth daily.  10/24/18  Yes Alwyn Ren, MD  QUEtiapine (SEROQUEL) 25 MG tablet Take 25 mg by mouth at bedtime. 09/27/18  Yes [provider]  senna-docusate (SENOKOT-S) 8.6-50 MG tablet Take 3 tablets by mouth 2 (two) times daily. 10/24/18  Yes Alwyn Ren, MD    Allergies    Patient has no known allergies.  Review of Systems   Review of Systems  Respiratory: Positive for shortness of breath.   Gastrointestinal: Positive for abdominal pain and constipation.  Neurological: Positive for weakness.  All other  systems reviewed and are negative.   Physical Exam Updated Vital Signs BP (!) 142/100   Pulse 87   Temp 98.5 F (36.9 C) (Rectal)   Resp (!) 30   SpO2 100%   Physical Exam Vitals and nursing note reviewed.  Constitutional:      Comments: Chronically ill, tired, dehydrated   HENT:     Head: Normocephalic.     Mouth/Throat:     Pharynx: Oropharynx is clear.  Eyes:     Pupils: Pupils are equal, round, and reactive to light.  Cardiovascular:     Rate and Rhythm: Normal rate and regular rhythm.  Pulmonary:     Effort: Pulmonary effort is normal.     Breath sounds: Normal breath sounds.  Abdominal:     General: Bowel sounds are normal.     Palpations: Abdomen is soft.     Comments: Rash on R flank extending to the R side of abdomen, there is no open vesicles or incrustation   Genitourinary:    Comments: Rectal- no obvious stool impaction  Musculoskeletal:        General: Normal range of motion.     Cervical back: Normal range of motion and neck supple.  Skin:    General: Skin is warm.     Capillary Refill: Capillary refill takes less than 2 seconds.  Neurological:     Comments: A & O x 2. CN 2- 12 intact, strength 4/5 throughout   Psychiatric:     Comments: Unable      ED Results / Procedures / Treatments   Labs (all labs ordered are listed, but only abnormal results are displayed) Labs Reviewed  I-STAT CHEM 8, ED - Abnormal; Notable for the following components:      Result Value   Sodium 134 (*)    Potassium 5.8 (*)    BUN 78 (*)    Creatinine, Ser 4.00 (*)    Calcium, Ion 1.02 (*)    TCO2 17 (*)    Hemoglobin 18.0 (*)    HCT 53.0 (*)    All other components within normal limits  CBC WITH DIFFERENTIAL/PLATELET  COMPREHENSIVE METABOLIC PANEL  BRAIN NATRIURETIC PEPTIDE  URINALYSIS, ROUTINE W REFLEX MICROSCOPIC  LIPASE, BLOOD  POC SARS CORONAVIRUS 2 AG -  ED  TROPONIN I (HIGH SENSITIVITY)    EKG EKG Interpretation  Date/Time:  Friday December 16 2019  12:50:27 EST Ventricular Rate:  124 PR Interval:    QRS Duration: 104 QT Interval:  375 QTC Calculation: 546 R Axis:   -39 Text Interpretation: Atrial fibrillation Left axis deviation Repolarization abnormality, prob rate related Prolonged QT interval Since last tracing rate faster Confirmed by Richardean Canal [phone]) on 01/09/2020 2:09:11 PM   Radiology DG Chest Port 1 View  Result Date: 12/24/2019 CLINICAL DATA:  Shortness of breath EXAM: PORTABLE CHEST 1 VIEW COMPARISON:  12/05/2019 FINDINGS: The heart size and mediastinal contours are stable. Round 11 mm nodular density projects over the inferolateral aspect the right lung field, possibly representing a nipple shadow. Otherwise, no new airspace consolidation. No pleural effusion or pneumothorax. Compression deformities within the midthoracic spine (approximately T7 and T8) are grossly stable from prior. IMPRESSION: 1. 11 mm rounded nodular density projects over the inferolateral aspect of the right lung field, possibly representing a nipple shadow. Repeat radiograph with nipple markers could be performed to confirm. 2. Otherwise, no acute cardiopulmonary findings. 3. Stable compression deformities within the midthoracic spine. Electronically Signed   By: Duanne Guess D.O.   On: 18-Dec-2019 13:35    Procedures Procedures (including critical care time)  CRITICAL CARE Performed by: Richardean Canal   Total critical care time: 30 minutes  Critical care time was exclusive of separately billable procedures and treating other patients.  Critical care was necessary to treat or prevent imminent or life-threatening deterioration.  Critical care was time spent personally by me on the following activities: development of treatment plan with patient and/or surrogate as well as nursing, discussions with consultants, evaluation of patient's response to treatment, examination of patient, obtaining history from patient or surrogate, ordering and performing  treatments and interventions, ordering and review of laboratory studies, ordering and review of radiographic studies, pulse oximetry and re-evaluation of patient's condition.   Medications Ordered in ED Medications  calcium gluconate 1 g in sodium chloride 0.9 % 100 mL IVPB (has no administration in time range)  sodium bicarbonate injection 50 mEq (has no administration in time range)  diltiazem (CARDIZEM) injection 10 mg (10 mg Intravenous Given December 18, 2019 1356)    ED Course  I have reviewed the triage vital signs and the nursing notes.  Pertinent labs & imaging results that were available during my care of the patient were reviewed by me and considered in my medical decision making (see chart for details).    MDM Rules/Calculators/A&P                     Brendan QAADIR KENT is a 86 y.o. male here with weakness, SOB, constipation, ab pain.  Patient was recently admitted for atrial fibrillation and CHF. His heart rate was 120s initially. Appears to be in rapid A. Fib.  Also he may have shingles on the right side of his abdomen explaining his abdominal pain.  Will get labs including troponin and BNP and CT head and CT renal stone.  Will give Cardizem for rapid A. Fib.  3:30 PM Patient's labs showed multi system failure. Patient has acute renal failure with hyperkalemia. Patient also has liver failure and heart failure. Patient has shingles as well. Talked to Dr. Arlean Hopping from nephrology. Recommend medical admission and no dialysis and palliative care. Had extensive discussion with the son. He wants patient to be full comfort care. Patient states that "just keep me comfortable, you are keeping me here to die right?". Given pain meds. Will admit for comfort measures.    Final Clinical Impression(s) / ED Diagnoses Final diagnoses:  None    Rx / DC Orders ED Discharge Orders    None       Charlynne Pander, MD 2019-12-18 1536

## 2019-12-16 NOTE — ED Notes (Signed)
EKG given to Dr. Yao.  

## 2019-12-17 DIAGNOSIS — Z7189 Other specified counseling: Secondary | ICD-10-CM

## 2019-12-17 LAB — SARS CORONAVIRUS 2 (TAT 6-24 HRS): SARS Coronavirus 2: NEGATIVE

## 2019-12-17 MED ORDER — LACTATED RINGERS IV SOLN: INTRAVENOUS | Status: DC

## 2019-12-17 MED ORDER — SCOPOLAMINE 1 MG/3DAYS TD PT72
1.0000 | MEDICATED_PATCH | TRANSDERMAL | Status: DC
  Administered 2019-12-17: 15:00:00 1.5 mg via TRANSDERMAL
  Filled 2019-12-17: qty 1

## 2019-12-17 MED ORDER — METOPROLOL TARTRATE 50 MG PO TABS
100.0000 mg | ORAL_TABLET | Freq: Two times a day (BID) | ORAL | Status: DC
  Administered 2019-12-17 – 2019-12-18 (×3): 100 mg via ORAL
  Filled 2019-12-17 (×3): qty 2

## 2019-12-17 MED ORDER — FUROSEMIDE 10 MG/ML IJ SOLN: 40.0000 mg | Freq: Two times a day (BID) | INTRAMUSCULAR | Status: DC | PRN

## 2019-12-17 MED ORDER — METOPROLOL TARTRATE 75 MG PO TABS: 150.0000 mg | ORAL_TABLET | Freq: Two times a day (BID) | ORAL | Status: DC

## 2019-12-17 NOTE — Consult Note (Signed)
Pending care progress note  Reason for consult: Goals of care in light of renal failure  Briefly, Clayton Hoffman is a 86 year old male with past medical history of heart failure and recent admission for A. fib with RVR who presents with increasing dyspnea and fatigue.  He represented to the ED on 12/25/2019 was admitted with plan for comfort care due to the fact that he had impaired kidney function and reported being "tired of it all."  Palliative consulted for goals of care.  Chart reviewed including personal review of pertinent labs and imaging.  Discussed with Dr. Deno Etienne prior to seeing Clayton Hoffman.  Discussed that he appeared to be very dry on examination and Dr. Deno Etienne had discussed with his daughter consideration for trial of IV fluids to see if he improves and felt better.  I saw and examined Clayton Hoffman today.  He is a pleasant yet very hard of hearing elderly gentleman who tells me that he is actually feeling pretty well today and had a very big lunch.  States the lunch taste the best that he can remember food tasting in a very long time.  He tells me that the most important things to him or his family including his children and his wife who is 13 years old and lives at [place].  He asked me to call his daughter in order to discuss further.  I called and was able to reach his daughter, Clayton Hoffman.  Clayton Hoffman reports that her father is an independent man but he has been more dependent recently as his health continues to decline.  We discussed his presentation to the emergency room and lab findings and plan for comfort care.  She reports that she had discussed with Dr. Rhona Leavens that her father appears to be doing better than when he presented to the emergency room.  Clayton Hoffman reports that family's goal is to ensure that he has quality for whatever time he has left, but they are also wanting to give appropriate gentle medical interventions if he has a chance to improve.  We discussed plan for continuation of gentle  hydration and rechecking labs tomorrow to reassess his situation.  -DNR/DNI -Continue gentle hydration overnight with plan to recheck labs in the morning.  Continue conversations regarding goals of care once determine how he does overnight with gentle medical therapies. -If his condition worsens, focus of care should be on comfort.  He does have my medications ordered for comfort and they should not be held if needed.  Time in 1755 Time out: 1845 Total time: 50 minutes Greater than 50%  of this time was spent counseling and coordinating care related to the above assessment and plan.  Romie Minus, MD [facility] Palliative Medicine Team [phone]

## 2019-12-17 NOTE — Progress Notes (Signed)
Pt admitted to 1601. Confirmed that pt is full comfort care. Pt denies any needs at this time. Reviewed with him actions that we can take to help keep him comfortable, pt verbalized understanding. Condom cath in place. Continue to monitor. Mick Sell RN

## 2019-12-17 NOTE — Progress Notes (Signed)
PROGRESS NOTE    Clayton Hoffman  YOV:[id number] DOB: 03-13-1922 DOA: 2019-12-22 PCP: Patient, [name]    Brief Narrative:  86 y.o. male with medical history significant of heart failure. Presents with increasing dyspnea and fatigue. Reports that his symptoms have been ongoing for more than a month and it's to the point that he's tired of it all. He knows of no allievating or aggravating factors. ED discussion with family resulted in patient wishing for comfort care measures. [facility] was consulted for admission.  Assessment & Plan:   Active Problems:   Elevated troponin   AKI (acute kidney injury) (HCC)   CKD (chronic kidney disease) stage 3, GFR 30-59 ml/min   Acute on chronic systolic CHF (congestive heart failure) (HCC)   Elevated LFTs   Palliative care encounter   DOE (dyspnea on exertion)   Generalized weakness   Acute on chronic systolic (congestive) heart failure (HCC)   Chronic systolic HF -Clinically dehydrated with dry mucus membranes, decreased skin turgor, increased thirst -Elevated LFT's noted, consider mild shock liver in setting of marked dehydration.  -Hgb also elevated at 18, again, reflecting dehydration -Elevated Cr and BUN suggesting pre-renal disease -Initial plan for full comfort measures noted at time of admit. Discussed with patient's daughter who is POA. Family states she is OK to give fluids and rehydrate pt -Appreciate Palliative Care input. Plan to continue hydration overnight and repeat labs in AM to ensure response to IVF -Will repeat CMP and CBC in AM  AKI on CKD 3b -BUN and Cr both markedly elevated in setting of clinical dehydration, suggesting pre-renal disease from dehydration -Will continue patient on gentle IVF as tolerated -Repeat renal panel in AM to ensure response  Elevated LFTs -In setting of dehydration, consider element of shock liver -Continue on IVF hydration and repeat LFT in AM  Afib -Currently rate controlled -Beta blocker was  not restarted at time of admit, have resumed -eliquis held at time of admit given initial plan for full comfort -Will re-assess in AM, if renal function improves, consider resuming  HTN -BP stable this AM -Have resumed metoprolol per home regimen  DVT prophylaxis: SCD Code Status: DNR Family Communication: Pt in room, POA over phone Disposition Plan: Uncertain at this time  Consultants:   Palliative Care  Procedures:     Antimicrobials: Anti-infectives (From admission, onward)   None       Subjective: Complaining of feeling thirsty this AM  Objective: Vitals:   12/17/19 0100 12/17/19 0230 12/17/19 0345 12/17/19 0349  BP: 137/67 131/71  134/82  Pulse: 67 60 70 64  Resp: 17 16  16   Temp:    (!) 97.5 F (36.4 C)  TempSrc:    Axillary  SpO2: 96% 96% 98% 96%  Weight:    69.3 kg  Height:    5\' 9"  (1.753 m)    Intake/Output Summary (Last 24 hours) at 12/17/2019 1757 Last data filed at 12/17/2019 0827 Gross per 24 hour  Intake 240 ml  Output --  Net 240 ml   Filed Weights   12/17/19 0349  Weight: 69.3 kg    Examination: General exam: Appears calm and comfortable  Respiratory system: Clear to auscultation. Respiratory effort normal. Cardiovascular system: S1 & S2 heard, Regular Gastrointestinal system: Abdomen is nondistended, soft and nontender. No organomegaly or masses felt. Normal bowel sounds heard. Central nervous system: Alert and oriented. No focal neurological deficits. Extremities: Symmetric 5 x 5 power. Skin: No rashes, lesions, decreased skin turgor  Psychiatry: Judgement and insight appear normal. Mood & affect appropriate.   Data Reviewed: I have personally reviewed following labs and imaging studies  CBC: Recent Labs  Lab 30-Dec-2019 1350 12-30-19 1355  WBC 8.7  --   NEUTROABS 6.5  --   HGB 17.2* 18.0*  HCT 53.2* 53.0*  MCV 97.3  --   PLT 178  --    Basic Metabolic Panel: Recent Labs  Lab 30-Dec-2019 1350 2019-12-30 1355  NA 136 134*  K  6.5* 5.8*  CL 104 109  CO2 17*  --   GLUCOSE 83 74  BUN 86* 78*  CREATININE 3.81* 4.00*  CALCIUM 9.2  --    GFR: Estimated Creatinine Clearance: 10.8 mL/min (A) (by C-G formula based on SCr of 4 mg/dL (H)). Liver Function Tests: Recent Labs  Lab 2019-12-30 1350  AST 706*  ALT 696*  ALKPHOS 100  BILITOT 2.5*  PROT 6.1*  ALBUMIN 3.5   Recent Labs  Lab 12/30/2019 1350  LIPASE 33   No results for input(s): AMMONIA in the last 168 hours. Coagulation Profile: No results for input(s): INR, PROTIME in the last 168 hours. Cardiac Enzymes: No results for input(s): CKTOTAL, CKMB, CKMBINDEX, TROPONINI in the last 168 hours. BNP (last 3 results) No results for input(s): PROBNP in the last 8760 hours. HbA1C: No results for input(s): HGBA1C in the last 72 hours. CBG: No results for input(s): GLUCAP in the last 168 hours. Lipid Profile: No results for input(s): CHOL, HDL, LDLCALC, TRIG, CHOLHDL, LDLDIRECT in the last 72 hours. Thyroid Function Tests: No results for input(s): TSH, T4TOTAL, FREET4, T3FREE, THYROIDAB in the last 72 hours. Anemia Panel: No results for input(s): VITAMINB12, FOLATE, FERRITIN, TIBC, IRON, RETICCTPCT in the last 72 hours. Sepsis Labs: No results for input(s): PROCALCITON, LATICACIDVEN in the last 168 hours.  Recent Results (from the past 240 hour(s))  SARS CORONAVIRUS 2 (TAT 6-24 HRS) Nasopharyngeal Nasopharyngeal Swab     Status: None   Collection Time: 12/30/19  3:53 PM   Specimen: Nasopharyngeal Swab  Result Value Ref Range Status   SARS Coronavirus 2 NEGATIVE NEGATIVE Final    Comment: (NOTE) SARS-CoV-2 target nucleic acids are NOT DETECTED. The SARS-CoV-2 RNA is generally detectable in upper and lower respiratory specimens during the acute phase of infection. Negative results do not preclude SARS-CoV-2 infection, do not rule out co-infections with other pathogens, and should not be used as the sole basis for treatment or other patient management  decisions. Negative results must be combined with clinical observations, patient history, and epidemiological information. The expected result is Negative. Fact Sheet for Patients: HairSlick.no Fact Sheet for Healthcare Providers: quierodirigir.com This test is not yet approved or cleared by the [place] FDA and  has been authorized for detection and/or diagnosis of SARS-CoV-2 by FDA under an Emergency Use Authorization (EUA). This EUA will remain  in effect (meaning this test can be used) for the duration of the COVID-19 declaration under Section 56 4(b)(1) of the Act, 21 U.S.C. section 360bbb-3(b)(1), unless the authorization is terminated or revoked sooner. Performed at [facility], [address] [place], [place] [address]      Radiology Studies: CT Head Wo Contrast  Result Date: 30-Dec-2019 CLINICAL DATA:  Weakness.  Failure to thrive. EXAM: CT HEAD WITHOUT CONTRAST TECHNIQUE: Contiguous axial images were obtained from the base of the skull through the vertex without intravenous contrast. COMPARISON:  None. FINDINGS: Brain: No evidence of acute infarction, hemorrhage, hydrocephalus, extra-axial collection or mass  lesion/mass effect. Global atrophy. Periventricular white matter hypodensity likely representing chronic small vessel ischemic change. Vascular: Calcification in the bilateral vertebral arteries and carotid siphons. No hyperdense vessel. Skull: Normal. Negative for fracture or focal lesion. Sinuses/Orbits: No acute finding. Other: None. IMPRESSION: No acute intracranial abnormality. Atrophy and chronic small vessel ischemic change. Electronically Signed   By: Emmaline Kluver M.D.   On: 01/05/2020 14:41   DG Chest Port 1 View  Result Date: 01/12/2020 CLINICAL DATA:  Shortness of breath EXAM: PORTABLE CHEST 1 VIEW COMPARISON:  12/05/2019 FINDINGS: The heart size and mediastinal contours are stable. Round 11 mm  nodular density projects over the inferolateral aspect the right lung field, possibly representing a nipple shadow. Otherwise, no new airspace consolidation. No pleural effusion or pneumothorax. Compression deformities within the midthoracic spine (approximately T7 and T8) are grossly stable from prior. IMPRESSION: 1. 11 mm rounded nodular density projects over the inferolateral aspect of the right lung field, possibly representing a nipple shadow. Repeat radiograph with nipple markers could be performed to confirm. 2. Otherwise, no acute cardiopulmonary findings. 3. Stable compression deformities within the midthoracic spine. Electronically Signed   By: Duanne Guess D.O.   On: 01/12/2020 13:35   CT Renal Stone Study  Result Date: 01/03/2020 CLINICAL DATA:  Diffuse abdominal pain and constipation. Left-sided hip pain. History of chronic renal insufficiency. EXAM: CT ABDOMEN AND PELVIS WITHOUT CONTRAST TECHNIQUE: Multidetector CT imaging of the abdomen and pelvis was performed following the standard protocol without IV contrast. COMPARISON:  CT abdomen pelvis-10/23/2018 FINDINGS: The lack of intravenous contrast limits the ability to evaluate solid abdominal organs. Lower chest: Limited visualization of the lower thorax demonstrates small bilateral pleural effusions with associated bibasilar heterogeneous/consolidative opacities, similar to abdominal CT performed 10/23/2018. Cardiomegaly. Coronary artery calcifications. Calcifications within the aortic valve leaflets. No pericardial effusion. Hepatobiliary: Normal hepatic contour. Normal noncontrast appearance of the gallbladder given degree of distention. No radiopaque gallstones. No ascites. Pancreas: The pancreas appears atrophic. Spleen: Dystrophic calcifications about the anterior aspect of the spleen, unchanged. Note is again made of small splenule. Adrenals/Urinary Tract: Bilateral kidneys appear atrophic compatible with provided history of chronic  kidney disease. Previously characterized approximately 1.2 cm right-sided parapelvic cyst appears morphologically unchanged compared to the 10/2018 examination. Exophytic hypoattenuating lesion arising from the posterosuperior aspect the left kidney is incompletely characterized on this noncontrast examination though also favored to represent a renal cyst. Linear calcification about the anterior aspect the left renal pelvis is favored to be vascular in etiology. No definite renal stones. No renal stones are seen along the expected course of either ureter or the urinary bladder. There is a minimal amount of likely age and body habitus related symmetric bilateral perinephric stranding. No urinary obstruction. Normal appearance of the urinary bladder given degree of distention. Normal appearance the bilateral adrenal glands. Stomach/Bowel: Large bilateral inguinal hernias, the left side of which contains a portion of nondilated descending sigmoid colon and the right side of which contains moderate to large segment of the terminal ileum, similar to abdominal CT performed 10/23/2018 and again not resulting in enteric obstruction. Normal appearance of the terminal ileum. The appendix is not visualized compatible with provided operative history. No pneumoperitoneum, pneumatosis or portal venous gas. Vascular/Lymphatic: Moderate to large amount of atherosclerotic plaque within normal caliber abdominal aorta. No bulky retroperitoneal, mesenteric, pelvic or inguinal lymphadenopathy on this noncontrast examination. Reproductive: Borderline prostatomegaly. There is a small amount of fluid in the pelvic cul-de-sac. Other: Large bilateral inguinal hernias as detailed  above. Minimal amount of subcutaneous edema about the midline of the low back. Musculoskeletal: No acute or aggressive osseous abnormalities. Moderate (approximately 50%) compression deformities involving the L4 and L5 vertebral bodies appears similar to the 10/2018  examination. IMPRESSION: 1. No acute findings. Specifically, no evidence of nephrolithiasis or urinary obstruction. 2. Redemonstrated large bilateral inguinal hernias, the left-sided hernia containing a portion of the descending and sigmoid colon and the right-side containing a moderate to large segment of the terminal ileum, neither of which results in enteric obstruction and are unchanged compared to the 10/2018 examination. 3. Unremarkable colonic stool burden. 4. Old compression deformities involving the L4 and L5 vertebral bodies, similar to the 10/2018 examination. 5. Cardiomegaly with small bilateral effusions, unchanged. 6. Coronary artery calcifications. Aortic Atherosclerosis (ICD10-I70.0). Electronically Signed   By: Sandi Mariscal M.D.   On: 12/28/2019 14:54    Scheduled Meds: . metoprolol tartrate  100 mg Oral BID  . QUEtiapine  25 mg Oral QHS  . scopolamine  1 patch Transdermal Q72H   Continuous Infusions: . lactated ringers 75 mL/hr at 12/17/19 1229     LOS: 1 day   Marylu Lund, MD [facility] Pager On Amion  If 7PM-7AM, please contact night-coverage 12/17/2019, 5:57 PM

## 2019-12-18 LAB — HEPATIC FUNCTION PANEL
ALT: 611 U/L — ABNORMAL HIGH (ref 0–44)
AST: 340 U/L — ABNORMAL HIGH (ref 15–41)
Albumin: 3 g/dL — ABNORMAL LOW (ref 3.5–5.0)
Alkaline Phosphatase: 90 U/L (ref 38–126)
Bilirubin, Direct: 1 mg/dL — ABNORMAL HIGH (ref 0.0–0.2)
Indirect Bilirubin: 1.1 mg/dL — ABNORMAL HIGH (ref 0.3–0.9)
Total Bilirubin: 2.1 mg/dL — ABNORMAL HIGH (ref 0.3–1.2)
Total Protein: 5.7 g/dL — ABNORMAL LOW (ref 6.5–8.1)

## 2019-12-18 LAB — BASIC METABOLIC PANEL
Anion gap: 13 (ref 5–15)
BUN: 86 mg/dL — ABNORMAL HIGH (ref 8–23)
CO2: 19 mmol/L — ABNORMAL LOW (ref 22–32)
Calcium: 8.8 mg/dL — ABNORMAL LOW (ref 8.9–10.3)
Chloride: 106 mmol/L (ref 98–111)
Creatinine, Ser: 3.15 mg/dL — ABNORMAL HIGH (ref 0.61–1.24)
GFR calc Af Amer: 18 mL/min — ABNORMAL LOW (ref >60)
GFR calc non Af Amer: 16 mL/min — ABNORMAL LOW (ref >60)
Glucose, Bld: 112 mg/dL — ABNORMAL HIGH (ref 70–99)
Potassium: 4.8 mmol/L (ref 3.5–5.1)
Sodium: 138 mmol/L (ref 135–145)

## 2019-12-18 MED ORDER — GLYCOPYRROLATE 0.2 MG/ML IJ SOLN: 0.2000 mg | INTRAMUSCULAR | Status: DC | PRN

## 2019-12-18 MED ORDER — HALOPERIDOL LACTATE 5 MG/ML IJ SOLN
0.5000 mg | Freq: Four times a day (QID) | INTRAMUSCULAR | Status: DC | PRN
  Administered 2019-12-19: 12:00:00 0.5 mg via INTRAVENOUS
  Filled 2019-12-18 (×2): qty 1

## 2019-12-18 NOTE — Progress Notes (Signed)
Daily Progress Note   Patient Name: Clayton Hoffman       Date: 12/18/2019 DOB: Apr 21, 1922  Age: 86 y.o. MRN#: [id number] Attending Physician: Jerald Kief, MD Primary Care Physician: Patient, [name] Admit Date: 01/05/2020  Reason for Consultation/Follow-up: Establishing goals of care  Subjective: I saw and examined Clayton Hoffman today.  He is much less interactive, sleepy and not able to participate in conversation.  I called and spoke with his daughter, Dois Davenport.  Length of Stay: 2  Current Medications: Scheduled Meds:  . metoprolol tartrate  100 mg Oral BID    Continuous Infusions: . lactated ringers 75 mL/hr at 12/18/19 0128    PRN Meds: furosemide, glycopyrrolate, haloperidol lactate, morphine injection, ondansetron **OR** ondansetron (ZOFRAN) IV  Physical Exam        Frail, confused, does not follow commands Decreased breath sounds Regular rate Nondidtended, nontender   Vital Signs: BP (!) 116/97   Pulse 92   Temp (!) 96.6 F (35.9 C) (Axillary)   Resp 17   Ht 5\' 9"  (1.753 m)   Wt 69.3 kg   SpO2 91%   BMI 22.56 kg/m  SpO2: SpO2: 91 % O2 Device: O2 Device: Room Air O2 Flow Rate:    Intake/output summary:   Intake/Output Summary (Last 24 hours) at 12/18/2019 2141 Last data filed at 12/18/2019 1900 Gross per 24 hour  Intake 1910.13 ml  Output --  Net 1910.13 ml   LBM: Last BM Date: (PTA) Baseline Weight: Weight: 69.3 kg Most recent weight: Weight: 69.3 kg       Palliative Assessment/Data:    Flowsheet Rows     Most Recent Value  Intake Tab  Referral Department  Hospitalist  Unit at Time of Referral  Oncology Unit  Palliative Care Primary Diagnosis  Nephrology  Date Notified  01/10/2020  Palliative Care Type  New Palliative care  Reason for  referral  Clarify Goals of Care  Date of Admission  01/03/2020  Date first seen by Palliative Care  12/17/19  # of days Palliative referral response time  1 Day(s)  # of days IP prior to Palliative referral  0  Clinical Assessment  Psychosocial & Spiritual Assessment  Palliative Care Outcomes  Patient/Family meeting held?  Yes  Who was at the meeting?  Patient, Daughter via phone  Patient Active Problem List   Diagnosis Date Noted  . Elevated LFTs 12-30-19  . Palliative care encounter 12-30-2019  . DOE (dyspnea on exertion) Dec 30, 2019  . Generalized weakness Dec 30, 2019  . Acute on chronic systolic (congestive) heart failure (HCC) 12/30/2019  . CKD (chronic kidney disease) stage 3, GFR 30-59 ml/min 12/06/2019  . Acute on chronic systolic CHF (congestive heart failure) (HCC) 12/06/2019  . Atrial fibrillation with RVR (HCC) 12/04/2019  . Chronic kidney disease   . LGI bleed 10/23/2018  . HTN (hypertension) 10/23/2018  . Rectal bleeding   . Acute respiratory failure with hypoxia (HCC)   . AKI (acute kidney injury) (HCC)   . Community acquired pneumonia   . Sepsis due to pneumonia (HCC) 06/17/2017  . Acute on chronic respiratory failure with hypoxia (HCC) 06/17/2017  . Urinary tract infection with hematuria 06/17/2017  . Hyponatremia 06/17/2017  . Elevated troponin 06/17/2017  . Acute renal failure (ARF) (HCC) 06/17/2017    Palliative Care Assessment & Plan   Patient Profile: Clayton Hoffman is a 86 year old male with past medical history of heart failure and recent admission for A. fib with RVR who presents with increasing dyspnea and fatigue. Was initially comfort care, but not plan for gentle interventions to see if he improves.  Recommendations/Plan:  While labs appear better today with decreasing creatinine, Clayton Hoffman clinically appears to be worse.  He is lying in bed and very sleepy, and when he does wake he does not really participate in conversation as he did  yesterday.  Discussed with Dr. Deno Etienne and we will plan for stopping of Seroquel, DC Ativan, DC scopolamine in favor of Robinul (Robinul does not cross blood-brain barrier and so therefore should cause less CNS side effects) and reassess situation again tomorrow.  I called and spoke with patient's daughter, Dois Davenport and updated her on care plan moving forward.  Code Status:    Code Status Orders  (From admission, onward)         Start     Ordered   12-30-19 2235  Do not attempt resuscitation (DNR)  Continuous    Question Answer Comment  In the event of cardiac or respiratory ARREST Do not call a "code blue"   In the event of cardiac or respiratory ARREST Do not perform Intubation, CPR, defibrillation or ACLS   In the event of cardiac or respiratory ARREST Use medication by any route, position, wound care, and other measures to relive pain and suffering. May use oxygen, suction and manual treatment of airway obstruction as needed for comfort.   Comments Confirmed by POA      30-Dec-2019 2234        Code Status History    Date Active Date Inactive Code Status Order ID Comments User Context   12-30-19 1502 December 30, 2019 2234 DNR [id number]  Charlynne Pander, MD ED   12/04/2019 1725 12/08/2019 1608 DNR [id number]  Emeline General, MD ED   10/23/2018 1646 10/24/2018 1755 DNR [id number]  Alwyn Ren, MD ED   06/17/2017 1954 06/22/2017 1753 DNR [id number]  Briscoe Deutscher, MD ED   06/17/2017 1837 06/17/2017 1953 DNR [id number]  Briscoe Deutscher, MD ED   Advance Care Planning Activity    Advance Directive Documentation     Most Recent Value  Type of Advance Directive  Living will, Healthcare Power of Attorney  Pre-existing out of facility DNR order (yellow form or pink MOST form)  --  "MOST" Form in Place?  --  Prognosis:   Guarded  Discharge Planning:  To Be Determined  Care plan was discussed with daughter  Thank you for allowing the Palliative Medicine Team to assist in the care of  this patient.   Total Time 30 Prolonged Time Billed No      Greater than 50%  of this time was spent counseling and coordinating care related to the above assessment and plan.  Micheline Rough, MD  Please contact Palliative Medicine Team phone at [phone] for questions and concerns.

## 2019-12-18 NOTE — Progress Notes (Addendum)
PROGRESS NOTE    Clayton Hoffman  NLG:[id number] DOB: [birth year] DOA: 12-25-19 PCP: Patient, [name]    Brief Narrative:  86 y.o. male with medical history significant of heart failure. Presents with increasing dyspnea and fatigue. Reports that his symptoms have been ongoing for more than a month and it's to the point that he's tired of it all. He knows of no allievating or aggravating factors. ED discussion with family resulted in patient wishing for comfort care measures. [facility] was consulted for admission.  Assessment & Plan:   Active Problems:   Elevated troponin   AKI (acute kidney injury) ([place])   CKD (chronic kidney disease) stage 3, GFR 30-59 ml/min   Acute on chronic systolic CHF (congestive heart failure) (HCC)   Elevated LFTs   Palliative care encounter   DOE (dyspnea on exertion)   Generalized weakness   Acute on chronic systolic (congestive) heart failure (HCC)   Chronic systolic HF, not in exacerbation -Clinically dehydrated with dry mucus membranes, decreased skin turgor, increased thirst -Elevated LFT's noted, consider mild shock liver in setting of marked dehydration.  -Hgb also elevated at 18, again, reflecting dehydration -Elevated Cr and BUN suggesting pre-renal disease -Initial plan for full comfort measures noted at time of admit. Discussed with patient's daughter who is POA. Family states she is OK to give fluids and rehydrate pt  -Appreciate Palliative Care input.  AKI on CKD 3b -BUN and Cr both markedly elevated in setting of clinical dehydration, suggesting pre-renal disease from dehydration -Pt had been continued on gentle IVF overnight with improvement in renal function, electrolytes, and liver function -Will recheck bmet in AM  Elevated LFTs -In setting of dehydration, consider element of mild shock liver -Improvement in LFT's with IVF overnight  Afib -Currently rate controlled -Beta blocker was not restarted at time of admit, have  resumed -eliquis held at time of admit -Will re-assess in AM, if renal function improves, consider resuming  HTN -BP stable this AM -Had resumed metoprolol per home regimen  Toxic metabolic encephalopathy -appearing more lethargic this AM -Pt did receive home seroquel and was given scopolamine for increased secretions, both metabolized in liver and both may be related to encephalopathy this AM -Discussed with Palliative Care. Plan to hold seroquel and ativan  DVT prophylaxis: SCD Code Status: DNR Family Communication: Pt in room, pt's son at bedside Disposition Plan: Uncertain at this time  Consultants:   Palliative Care  Procedures:     Antimicrobials: Anti-infectives (From admission, onward)   None      Subjective: Unable to assess given current mentation  Objective: Vitals:   12/17/19 0349 12/17/19 2127 12/18/19 0514 12/18/19 1117  BP: 134/82 127/71 118/75 (!) 116/97  Pulse: 64 69 (!) 107 92  Resp: 16 20 17    Temp: (!) 97.5 F (36.4 C) (!) 96.6 F (35.9 C)    TempSrc: Axillary Axillary    SpO2: 96% 91% 91%   Weight: 69.3 kg     Height: 5\' 9"  (1.753 m)       Intake/Output Summary (Last 24 hours) at 12/18/2019 1645 Last data filed at 12/18/2019 0207 Gross per 24 hour  Intake 645 ml  Output 400 ml  Net 245 ml   Filed Weights   12/17/19 0349  Weight: 69.3 kg    Examination: General exam: Lethargic, in no acute distress Respiratory system: normal chest rise, clear, no audible wheezing Cardiovascular system: regular rhythm, s1-s2 Gastrointestinal system: Nondistended, nontender, pos BS Central nervous system: No  seizures, no tremors Extremities: No cyanosis, no joint deformities Skin: No rashes, no pallor Psychiatry: unable to assess given current mentation  Data Reviewed: I have personally reviewed following labs and imaging studies  CBC: Recent Labs  Lab 2020-01-02 1350 01-02-20 1355  WBC 8.7  --   NEUTROABS 6.5  --   HGB 17.2* 18.0*  HCT  53.2* 53.0*  MCV 97.3  --   PLT 178  --    Basic Metabolic Panel: Recent Labs  Lab 01/02/20 1350 01/02/20 1355 12/18/19 0549  NA 136 134* 138  K 6.5* 5.8* 4.8  CL 104 109 106  CO2 17*  --  19*  GLUCOSE 83 74 112*  BUN 86* 78* 86*  CREATININE 3.81* 4.00* 3.15*  CALCIUM 9.2  --  8.8*   GFR: Estimated Creatinine Clearance: 13.8 mL/min (A) (by C-G formula based on SCr of 3.15 mg/dL (H)). Liver Function Tests: Recent Labs  Lab 01-02-20 1350 12/18/19 0755  AST 706* 340*  ALT 696* 611*  ALKPHOS 100 90  BILITOT 2.5* 2.1*  PROT 6.1* 5.7*  ALBUMIN 3.5 3.0*   Recent Labs  Lab Jan 02, 2020 1350  LIPASE 33   No results for input(s): AMMONIA in the last 168 hours. Coagulation Profile: No results for input(s): INR, PROTIME in the last 168 hours. Cardiac Enzymes: No results for input(s): CKTOTAL, CKMB, CKMBINDEX, TROPONINI in the last 168 hours. BNP (last 3 results) No results for input(s): PROBNP in the last 8760 hours. HbA1C: No results for input(s): HGBA1C in the last 72 hours. CBG: No results for input(s): GLUCAP in the last 168 hours. Lipid Profile: No results for input(s): CHOL, HDL, LDLCALC, TRIG, CHOLHDL, LDLDIRECT in the last 72 hours. Thyroid Function Tests: No results for input(s): TSH, T4TOTAL, FREET4, T3FREE, THYROIDAB in the last 72 hours. Anemia Panel: No results for input(s): VITAMINB12, FOLATE, FERRITIN, TIBC, IRON, RETICCTPCT in the last 72 hours. Sepsis Labs: No results for input(s): PROCALCITON, LATICACIDVEN in the last 168 hours.  Recent Results (from the past 240 hour(s))  SARS CORONAVIRUS 2 (TAT 6-24 HRS) Nasopharyngeal Nasopharyngeal Swab     Status: None   Collection Time: 01-02-2020  3:53 PM   Specimen: Nasopharyngeal Swab  Result Value Ref Range Status   SARS Coronavirus 2 NEGATIVE NEGATIVE Final    Comment: (NOTE) SARS-CoV-2 target nucleic acids are NOT DETECTED. The SARS-CoV-2 RNA is generally detectable in upper and lower respiratory  specimens during the acute phase of infection. Negative results do not preclude SARS-CoV-2 infection, do not rule out co-infections with other pathogens, and should not be used as the sole basis for treatment or other patient management decisions. Negative results must be combined with clinical observations, patient history, and epidemiological information. The expected result is Negative. Fact Sheet for Patients: HairSlick.no Fact Sheet for Healthcare Providers: quierodirigir.com This test is not yet approved or cleared by the [place] FDA and  has been authorized for detection and/or diagnosis of SARS-CoV-2 by FDA under an Emergency Use Authorization (EUA). This EUA will remain  in effect (meaning this test can be used) for the duration of the COVID-19 declaration under Section 56 4(b)(1) of the Act, 21 U.S.C. section 360bbb-3(b)(1), unless the authorization is terminated or revoked sooner. Performed at [facility], [address], [place], [place] [address]      Radiology Studies: No results found.  Scheduled Meds: . metoprolol tartrate  100 mg Oral BID   Continuous Infusions: . lactated ringers 75 mL/hr at 12/18/19 0128  LOS: 2 days   Rickey Barbara, MD [facility] Pager On Amion  If 7PM-7AM, please contact night-coverage 12/18/2019, 4:45 PM

## 2019-12-18 NOTE — Progress Notes (Signed)
Patient has been sleeping all day. Son was in to visit, tried to wake patient, he would move his arms and hands, flutter is eyes then go back to sleep.

## 2019-12-19 LAB — BASIC METABOLIC PANEL
Anion gap: 16 — ABNORMAL HIGH (ref 5–15)
BUN: 91 mg/dL — ABNORMAL HIGH (ref 8–23)
CO2: 17 mmol/L — ABNORMAL LOW (ref 22–32)
Calcium: 9.1 mg/dL (ref 8.9–10.3)
Chloride: 108 mmol/L (ref 98–111)
Creatinine, Ser: 3.27 mg/dL — ABNORMAL HIGH (ref 0.61–1.24)
GFR calc Af Amer: 18 mL/min — ABNORMAL LOW (ref >60)
GFR calc non Af Amer: 15 mL/min — ABNORMAL LOW (ref >60)
Glucose, Bld: 99 mg/dL (ref 70–99)
Potassium: 5.4 mmol/L — ABNORMAL HIGH (ref 3.5–5.1)
Sodium: 141 mmol/L (ref 135–145)

## 2019-12-19 MED ORDER — LIP MEDEX EX OINT
TOPICAL_OINTMENT | CUTANEOUS | Status: AC
  Filled 2019-12-19: qty 7

## 2019-12-19 MED ORDER — METOPROLOL TARTRATE 5 MG/5ML IV SOLN: 5.0000 mg | Freq: Four times a day (QID) | INTRAVENOUS | Status: DC

## 2019-12-19 MED ORDER — TRAMADOL HCL 50 MG PO TABS: 50.0000 mg | ORAL_TABLET | Freq: Once | ORAL | Status: DC

## 2020-01-16 NOTE — Progress Notes (Signed)
Called to floor by RN as patient noted to not be breathing on rounding check.  I examined Clayton Hoffman.  Lying in bed.  No respiratory effort. Does not respond to verbal or tactile stimulation. Pupils fixed and dilated.  No corneal reflex. No withdrawal to noxoius stimuli. No central or peripheral pulses. No auscultatable heart or lung sounds for > 1 minute.  Time of death 25  Called and notified his daughter, Clayton Hoffman.  She will call and notify her brother.  Notified Dr. Rhona Leavens.  Romie Minus, MD [facility] Palliative Medicine Team [phone]

## 2020-01-16 NOTE — Discharge Summary (Addendum)
Death Summary  Clayton FootsLucius W [name]:[id number] DOB: 02/04/1922 DOA: 01/02/2020  PCP: Patient, [name]  Admit date: 12/23/2019 Date of Death: 01/02/2020 Time of Death: 1317 Notification: Patient, [name] notified of death of 01/01/2020   History of present illness:  86 y.o.malewith medical history significant ofheart failure. Presents with increasing dyspnea and fatigue. Reports that his symptoms have been ongoing for more than a month and it's to the point that he's tired of it all. He knows of no allievatingor aggravating factors. ED discussion with family resulted in patient wishing for comfort care measures. [facility] was consulted for admission.  Final Diagnoses:  Active Problems:   Elevated troponin   AKI (acute kidney injury) (HCC)   CKD (chronic kidney disease) stage 3, GFR 30-59 ml/min   Acute on chronic systolic CHF (congestive heart failure) (HCC)   Elevated LFTs   Palliative care encounter   DOE (dyspnea on exertion)   Generalized weakness   Chronic systolic (congestive) heart failure (HCC) Hyperkalemia  Patient was admitted to the medical floor. Pt initially appeared clinically dehydrated and was alert, albeit hard of hearing. IVF was continued with improvement in renal and liver function. However, patient's mentation worsened and he became more lethargic. Sedating meds were held and patient was continued on IVF hydration. Despite fluid hydration, patient did not significantly improve and on the afternoon at 1317 on 12/31/2019, patient was found without spontaneous respirations. Pt was pronounced. Of note, Palliative Care had been following and family had been updated throughout the hospital visit.   -Chronic systolic HF, not in exacerbation -AKI on CKD 3b -Elevated LFTs -Afib -HTN -Toxic metabolic encephalopathy  The results of significant diagnostics from this hospitalization (including imaging, microbiology, ancillary and laboratory) are listed below for reference.     Significant Diagnostic Studies: CT Head Wo Contrast  Result Date: 01/10/2020 CLINICAL DATA:  Weakness.  Failure to thrive. EXAM: CT HEAD WITHOUT CONTRAST TECHNIQUE: Contiguous axial images were obtained from the base of the skull through the vertex without intravenous contrast. COMPARISON:  None. FINDINGS: Brain: No evidence of acute infarction, hemorrhage, hydrocephalus, extra-axial collection or mass lesion/mass effect. Global atrophy. Periventricular white matter hypodensity likely representing chronic small vessel ischemic change. Vascular: Calcification in the bilateral vertebral arteries and carotid siphons. No hyperdense vessel. Skull: Normal. Negative for fracture or focal lesion. Sinuses/Orbits: No acute finding. Other: None. IMPRESSION: No acute intracranial abnormality. Atrophy and chronic small vessel ischemic change. Electronically Signed   By: Emmaline KluverNancy  Ballantyne M.D.   On: 01/02/2020 14:41   DG Chest Port 1 View  Result Date: 12/27/2019 CLINICAL DATA:  Shortness of breath EXAM: PORTABLE CHEST 1 VIEW COMPARISON:  12/05/2019 FINDINGS: The heart size and mediastinal contours are stable. Round 11 mm nodular density projects over the inferolateral aspect the right lung field, possibly representing a nipple shadow. Otherwise, no new airspace consolidation. No pleural effusion or pneumothorax. Compression deformities within the midthoracic spine (approximately T7 and T8) are grossly stable from prior. IMPRESSION: 1. 11 mm rounded nodular density projects over the inferolateral aspect of the right lung field, possibly representing a nipple shadow. Repeat radiograph with nipple markers could be performed to confirm. 2. Otherwise, no acute cardiopulmonary findings. 3. Stable compression deformities within the midthoracic spine. Electronically Signed   By: Duanne GuessNicholas  Plundo D.O.   On: 12/18/2019 13:35   DG Chest Port 1 View  Addendum Date: 12/05/2019   ADDENDUM REPORT: 12/05/2019 09:35 ADDENDUM:  Stable compression of a midthoracic vertebral body. Electronically Signed  By: Lowella Grip III M.D.   On: 12/05/2019 09:35   Result Date: 12/05/2019 CLINICAL DATA:  Congestive heart failure EXAM: PORTABLE CHEST 1 VIEW COMPARISON:  December 04, 2019 FINDINGS: There is persistent cardiomegaly with pulmonary vascularity within normal limits. There is ill-defined opacity in the left base, stable. There is a rather minimal left pleural effusion. There is persistent bilateral apical pleural thickening. The interstitium is thickened with questionable mild degree of interstitial edema. There is a calcified granuloma in the left lower lobe. There is aortic atherosclerosis.  No adenopathy. IMPRESSION: Stable cardiomegaly with pulmonary vascularity within normal limits. Ill-defined opacity left base appears stable. There may be scarring in this area. A degree of superimposed pneumonia in this area is question. There is a minimal left pleural effusion. Suspect trace interstitial edema. Calcified granuloma left lower lobe, stable. No new opacity evident. Aortic Atherosclerosis (ICD10-I70.0). Electronically Signed: By: Lowella Grip III M.D. On: 12/05/2019 07:58   DG Chest Port 1 View  Result Date: 12/04/2019 CLINICAL DATA:  Chest pain with exertional shortness of breath and difficulty urinating for 2 weeks. History of atrial fibrillation. EXAM: PORTABLE CHEST 1 VIEW COMPARISON:  Chest radiographs 06/17/2017. Abdominopelvic CT 10/23/2018. FINDINGS: 1429 hours. Two views obtained. The heart size and mediastinal contours are stable with aortic atherosclerosis. Left-greater-than-right biapical scarring appears stable. The pleural effusions demonstrated on prior abdominal CT have improved, although there is a possible small residual/recurrent left pleural effusion. There is retrocardiac opacity at the left lung base which appears improved compared with the prior chest radiographs. This could reflect scarring,  atelectasis or an infiltrate. The pulmonary vasculature is somewhat indistinct without overt pulmonary edema. A midthoracic compression deformity appears somewhat progressive compared with prior radiographs. IMPRESSION: 1. Vascular congestion without overt pulmonary edema. 2. Left basilar pleuroparenchymal opacities appear improved compared with prior studies and may be chronic versus recurrent airspace disease and effusion. Radiographic follow up recommended. Electronically Signed   By: Richardean Sale M.D.   On: 12/04/2019 14:46   DG Abd Portable 2V  Result Date: 12/07/2019 CLINICAL DATA:  86 year old male with abdominal pain. EXAM: PORTABLE ABDOMEN - 2 VIEW COMPARISON:  None. FINDINGS: There is no bowel dilatation or evidence of obstruction. No definite free air identified. Atherosclerotic calcification of the aorta. There is osteopenia with degenerative changes of the spine. No acute osseous pathology. IMPRESSION: No bowel dilatation or evidence of obstruction. Electronically Signed   By: Anner Crete M.D.   On: 12/07/2019 18:16   ECHOCARDIOGRAM COMPLETE  Result Date: 12/05/2019   ECHOCARDIOGRAM REPORT   Patient Name:   Clayton Hoffman Date of Exam: 12/05/2019 Medical Rec #:  [id number]        Height:       69.0 in Accession #:    [id number]       Weight:       150.1 lb Date of Birth:  [birth year]         BSA:          1.83 m Patient Age:    86 years         BP:           131/90 mmHg Patient Gender: M                HR:           102 bpm. Exam Location:  Inpatient Procedure: 2D Echo Indications:    Atrial Fibrillation 427.31 / I48.91  History:  Patient has no prior history of Echocardiogram examinations.                 Risk Factors:Hypertension. Acute renal failure.  Sonographer:    Leeroy Bock Turrentine Referring Phys: [id number] Emeline General IMPRESSIONS  1. Left ventricular ejection fraction, by visual estimation, is 35 to 40%. The left ventricle has severely decreased function. There is no left  ventricular hypertrophy.  2. The left ventricle demonstrates global hypokinesis.  3. Global right ventricle has normal systolic function.The right ventricular size is normal. No increase in right ventricular wall thickness.  4. Left atrial size was moderately dilated.  5. Right atrial size was normal.  6. The mitral valve is normal in structure. Mild mitral valve regurgitation. No evidence of mitral stenosis.  7. The tricuspid valve is normal in structure. Tricuspid valve regurgitation moderate.  8. The aortic valve has an indeterminant number of cusps. Aortic valve regurgitation is mild. No evidence of aortic valve sclerosis or stenosis.  9. There is severe calcifcation of the aortic valve. 10. There is severe thickening of the aortic valve. 11. Pulmonic regurgitation is mild. 12. The pulmonic valve was normal in structure. Pulmonic valve regurgitation is mild. 13. Mildly elevated pulmonary artery systolic pressure. 14. The tricuspid regurgitant velocity is 2.77 m/s, and with an assumed right atrial pressure of 3 mmHg, the estimated right ventricular systolic pressure is mildly elevated at 33.6 mmHg. 15. The inferior vena cava is normal in size with greater than 50% respiratory variability, suggesting right atrial pressure of 3 mmHg. FINDINGS  Left Ventricle: Left ventricular ejection fraction, by visual estimation, is 35 to 40%. The left ventricle has severely decreased function. The left ventricle demonstrates global hypokinesis. There is no left ventricular hypertrophy. Normal left atrial pressure. Right Ventricle: The right ventricular size is normal. No increase in right ventricular wall thickness. Global RV systolic function is has normal systolic function. The tricuspid regurgitant velocity is 2.77 m/s, and with an assumed right atrial pressure  of 3 mmHg, the estimated right ventricular systolic pressure is mildly elevated at 33.6 mmHg. Left Atrium: Left atrial size was moderately dilated. Right Atrium:  Right atrial size was normal in size Pericardium: There is no evidence of pericardial effusion. Mitral Valve: The mitral valve is normal in structure. Mild mitral valve regurgitation. No evidence of mitral valve stenosis by observation. Tricuspid Valve: The tricuspid valve is normal in structure. Tricuspid valve regurgitation moderate. Aortic Valve: The aortic valve has an indeterminant number of cusps. . There is severe thickening and severe calcifcation of the aortic valve. Aortic valve regurgitation is mild. Aortic regurgitation PHT measures 292 msec. The aortic valve is structurally normal, with no evidence of sclerosis or stenosis. There is severe thickening of the aortic valve. There is severe calcifcation of the aortic valve. Aortic valve mean gradient measures 12.0 mmHg. Aortic valve peak gradient measures 19.8 mmHg. Aortic valve area, by VTI measures 0.73 cm. Pulmonic Valve: The pulmonic valve was normal in structure. Pulmonic valve regurgitation is mild. Pulmonic regurgitation is mild. Aorta: The aortic root, ascending aorta and aortic arch are all structurally normal, with no evidence of dilitation or obstruction. Venous: The inferior vena cava is normal in size with greater than 50% respiratory variability, suggesting right atrial pressure of 3 mmHg. IAS/Shunts: No atrial level shunt detected by color flow Doppler. There is no evidence of a patent foramen ovale. No ventricular septal defect is seen or detected. There is no evidence of an atrial septal defect.  LEFT  VENTRICLE PLAX 2D LVIDd:         5.29 cm LVIDs:         4.21 cm LV PW:         1.02 cm LV IVS:        1.03 cm LVOT diam:     1.90 cm LV SV:         56 ml LV SV Index:   30.61 LVOT Area:     2.84 cm  RIGHT VENTRICLE RV S prime:     6.85 cm/s TAPSE (M-mode): 1.3 cm LEFT ATRIUM              Index       RIGHT ATRIUM           Index LA diam:        5.00 cm  2.73 cm/m  RA Area:     26.10 cm LA Vol (A2C):   104.0 ml 56.87 ml/m RA Volume:   86.20  ml  47.14 ml/m LA Vol (A4C):   84.0 ml  45.93 ml/m LA Biplane Vol: 92.5 ml  50.58 ml/m  AORTIC VALVE AV Area (Vmax):    0.81 cm AV Area (Vmean):   0.76 cm AV Area (VTI):     0.73 cm AV Vmax:           222.25 cm/s AV Vmean:          165.250 cm/s AV VTI:            0.429 m AV Peak Grad:      19.8 mmHg AV Mean Grad:      12.0 mmHg LVOT Vmax:         63.30 cm/s LVOT Vmean:        44.100 cm/s LVOT VTI:          0.110 m LVOT/AV VTI ratio: 0.26 AI PHT:            292 msec  AORTA Ao Root diam: 2.80 cm MR Peak grad: 106.5 mmHg  TRICUSPID VALVE MR Mean grad: 64.0 mmHg   TR Peak grad:   30.6 mmHg MR Vmax:      516.00 cm/s TR Vmax:        295.00 cm/s MR Vmean:     380.0 cm/s                           SHUNTS                           Systemic VTI:  0.11 m                           Systemic Diam: 1.90 cm  Donato Schultz MD Electronically signed by Donato Schultz MD Signature Date/Time: 12/05/2019/1:10:52 PM    Final    CT Renal Stone Study  Result Date: 12/26/2019 CLINICAL DATA:  Diffuse abdominal pain and constipation. Left-sided hip pain. History of chronic renal insufficiency. EXAM: CT ABDOMEN AND PELVIS WITHOUT CONTRAST TECHNIQUE: Multidetector CT imaging of the abdomen and pelvis was performed following the standard protocol without IV contrast. COMPARISON:  CT abdomen pelvis-10/23/2018 FINDINGS: The lack of intravenous contrast limits the ability to evaluate solid abdominal organs. Lower chest: Limited visualization of the lower thorax demonstrates small bilateral pleural effusions with associated bibasilar heterogeneous/consolidative opacities, similar to abdominal CT performed 10/23/2018. Cardiomegaly. Coronary artery calcifications. Calcifications  within the aortic valve leaflets. No pericardial effusion. Hepatobiliary: Normal hepatic contour. Normal noncontrast appearance of the gallbladder given degree of distention. No radiopaque gallstones. No ascites. Pancreas: The pancreas appears atrophic. Spleen: Dystrophic  calcifications about the anterior aspect of the spleen, unchanged. Note is again made of small splenule. Adrenals/Urinary Tract: Bilateral kidneys appear atrophic compatible with provided history of chronic kidney disease. Previously characterized approximately 1.2 cm right-sided parapelvic cyst appears morphologically unchanged compared to the 10/2018 examination. Exophytic hypoattenuating lesion arising from the posterosuperior aspect the left kidney is incompletely characterized on this noncontrast examination though also favored to represent a renal cyst. Linear calcification about the anterior aspect the left renal pelvis is favored to be vascular in etiology. No definite renal stones. No renal stones are seen along the expected course of either ureter or the urinary bladder. There is a minimal amount of likely age and body habitus related symmetric bilateral perinephric stranding. No urinary obstruction. Normal appearance of the urinary bladder given degree of distention. Normal appearance the bilateral adrenal glands. Stomach/Bowel: Large bilateral inguinal hernias, the left side of which contains a portion of nondilated descending sigmoid colon and the right side of which contains moderate to large segment of the terminal ileum, similar to abdominal CT performed 10/23/2018 and again not resulting in enteric obstruction. Normal appearance of the terminal ileum. The appendix is not visualized compatible with provided operative history. No pneumoperitoneum, pneumatosis or portal venous gas. Vascular/Lymphatic: Moderate to large amount of atherosclerotic plaque within normal caliber abdominal aorta. No bulky retroperitoneal, mesenteric, pelvic or inguinal lymphadenopathy on this noncontrast examination. Reproductive: Borderline prostatomegaly. There is a small amount of fluid in the pelvic cul-de-sac. Other: Large bilateral inguinal hernias as detailed above. Minimal amount of subcutaneous edema about the  midline of the low back. Musculoskeletal: No acute or aggressive osseous abnormalities. Moderate (approximately 50%) compression deformities involving the L4 and L5 vertebral bodies appears similar to the 10/2018 examination. IMPRESSION: 1. No acute findings. Specifically, no evidence of nephrolithiasis or urinary obstruction. 2. Redemonstrated large bilateral inguinal hernias, the left-sided hernia containing a portion of the descending and sigmoid colon and the right-side containing a moderate to large segment of the terminal ileum, neither of which results in enteric obstruction and are unchanged compared to the 10/2018 examination. 3. Unremarkable colonic stool burden. 4. Old compression deformities involving the L4 and L5 vertebral bodies, similar to the 10/2018 examination. 5. Cardiomegaly with small bilateral effusions, unchanged. 6. Coronary artery calcifications. Aortic Atherosclerosis (ICD10-I70.0). Electronically Signed   By: Simonne ComeJohn  Watts M.D.   On: July 08, 2020 14:54    Microbiology: Recent Results (from the past 240 hour(s))  SARS CORONAVIRUS 2 (TAT 6-24 HRS) Nasopharyngeal Nasopharyngeal Swab     Status: None   Collection Time: 2020/01/01  3:53 PM   Specimen: Nasopharyngeal Swab  Result Value Ref Range Status   SARS Coronavirus 2 NEGATIVE NEGATIVE Final    Comment: (NOTE) SARS-CoV-2 target nucleic acids are NOT DETECTED. The SARS-CoV-2 RNA is generally detectable in upper and lower respiratory specimens during the acute phase of infection. Negative results do not preclude SARS-CoV-2 infection, do not rule out co-infections with other pathogens, and should not be used as the sole basis for treatment or other patient management decisions. Negative results must be combined with clinical observations, patient history, and epidemiological information. The expected result is Negative. Fact Sheet for Patients: HairSlick.nohttps://www.fda.gov/media/138098/download Fact Sheet for Healthcare  Providers: quierodirigir.comhttps://www.fda.gov/media/138095/download This test is not yet approved or cleared by the [place] FDA and  has been authorized for detection and/or diagnosis of SARS-CoV-2 by FDA under an Emergency Use Authorization (EUA). This EUA will remain  in effect (meaning this test can be used) for the duration of the COVID-19 declaration under Section 56 4(b)(1) of the Act, 21 U.S.C. section 360bbb-3(b)(1), unless the authorization is terminated or revoked sooner. Performed at [facility], [address], [place], [place] [address]      Labs: Basic Metabolic Panel: Recent Labs  Lab Dec 21, 2019 1350 12/21/19 1355 12/18/19 0549 12/20/2019 0434  NA 136 134* 138 141  K 6.5* 5.8* 4.8 5.4*  CL 104 109 106 108  CO2 17*  --  19* 17*  GLUCOSE 83 74 112* 99  BUN 86* 78* 86* 91*  CREATININE 3.81* 4.00* 3.15* 3.27*  CALCIUM 9.2  --  8.8* 9.1   Liver Function Tests: Recent Labs  Lab December 21, 2019 1350 12/18/19 0755  AST 706* 340*  ALT 696* 611*  ALKPHOS 100 90  BILITOT 2.5* 2.1*  PROT 6.1* 5.7*  ALBUMIN 3.5 3.0*   Recent Labs  Lab December 21, 2019 1350  LIPASE 33   No results for input(s): AMMONIA in the last 168 hours. CBC: Recent Labs  Lab 12-21-19 1350 2019-12-21 1355  WBC 8.7  --   NEUTROABS 6.5  --   HGB 17.2* 18.0*  HCT 53.2* 53.0*  MCV 97.3  --   PLT 178  --    Cardiac Enzymes: No results for input(s): CKTOTAL, CKMB, CKMBINDEX, TROPONINI in the last 168 hours. D-Dimer No results for input(s): DDIMER in the last 72 hours. BNP: Invalid input(s): POCBNP CBG: No results for input(s): GLUCAP in the last 168 hours. Anemia work up No results for input(s): VITAMINB12, FOLATE, FERRITIN, TIBC, IRON, RETICCTPCT in the last 72 hours. Urinalysis    Component Value Date/Time   COLORURINE YELLOW 12-21-2019 1704   APPEARANCEUR CLEAR 12/21/19 1704   LABSPEC 1.023 2019-12-21 1704   PHURINE 5.0 12-21-19 1704   GLUCOSEU NEGATIVE 2019/12/21 1704   HGBUR NEGATIVE  12-21-2019 1704   BILIRUBINUR NEGATIVE 2019-12-21 1704   KETONESUR 5 (A) Dec 21, 2019 1704   PROTEINUR 100 (A) December 21, 2019 1704   NITRITE NEGATIVE 12/21/19 1704   LEUKOCYTESUR NEGATIVE 2019-12-21 1704   Sepsis Labs Invalid input(s): PROCALCITONIN,  WBC,  LACTICIDVEN    SIGNED:  Rickey Barbara, MD  [facility] 01/02/2020, 1:32 PM  If 7PM-7AM, please contact night-coverage [email or web address] Password [email or web address]

## 2020-01-16 NOTE — Progress Notes (Signed)
RN went into room. Pt found to be pulseless and without respirations. Time of death 29. Verified with Rivka Spring, RN. Dr. Blanchie Serve, [facility], and [facility] notified. Daughter Dois Davenport also notified and she came to pick up patient belongings.  Pt transported to morgue.

## 2020-01-16 NOTE — Progress Notes (Signed)
Daily Progress Note   Patient Name: Clayton Hoffman       Date: 01/13/2020 DOB: [birth year]  Age: 86 y.o. MRN#: [id number] Attending Physician: Jerald Kief, MD Primary Care Physician: Patient, [name] Admit Date: 2020/01/12  Reason for Consultation/Follow-up: Establishing goals of care  Subjective: I saw and examined Clayton Hoffman today.    Unfortunately, he has continued decline despite gentle medical interventions.  He is largely somnolent on exam today.  I called and spoke with his daughter, Clayton Hoffman.  We discussed the emotional roller coaster of her father being admitted on comfort care, having significant improvement for 1 day which brought hope that he will be recover, and now having the realization that he is not going to recover.  Discussed options of care moving forward including continuation of current interventions versus focusing only on interventions for his comfort.  Clayton Hoffman expressed that family has been preparing for the fact that he may not get better and is in agreement with transition back to full comfort care as originally discussed on admission.  We also began discussion regarding potential for transition to residential hospice for end-of-life care.  Based upon his acute decline over the past couple of days, Clayton Hoffman reports being anxious about plan to try and move him too quickly and being uncomfortable or dying in transit.  Length of Stay: 3  Current Medications: Scheduled Meds:  . metoprolol tartrate  5 mg Intravenous Q6H  . traMADol  50 mg Oral Once    Continuous Infusions: . lactated ringers 10 mL/hr at 01/04/2020 1132    PRN Meds: furosemide, glycopyrrolate, haloperidol lactate, morphine injection, ondansetron **OR** ondansetron (ZOFRAN) IV  Physical Exam         Frail, confused, somnolent today Decreased breath sounds Regular rate Nondistended, nontender   Vital Signs: BP (!) 144/79   Pulse (!) 149   Temp 98.4 F (36.9 C) (Oral)   Resp 20   Ht 5\' 9"  (1.753 m)   Wt 69.3 kg   SpO2 91%   BMI 22.56 kg/m  SpO2: SpO2: 91 % O2 Device: O2 Device: Room Air O2 Flow Rate:    Intake/output summary:   Intake/Output Summary (Last 24 hours) at 12/18/2019 1228 Last data filed at 12/26/2019 0900 Gross per 24 hour  Intake 1265.13 ml  Output --  Net 1265.13 ml   LBM: Last BM Date: (PTA) Baseline Weight: Weight: 69.3 kg Most recent weight: Weight: 69.3 kg       Palliative Assessment/Data:    Flowsheet Rows     Most Recent Value  Intake Tab  Referral Department  Hospitalist  Unit at Time of Referral  Oncology Unit  Palliative Care Primary Diagnosis  Nephrology  Date Notified  12-24-19  Palliative Care Type  New Palliative care  Reason for referral  Clarify Goals of Care  Date of Admission  2019-12-24  Date first seen by Palliative Care  12/17/19  # of days Palliative referral response time  1 Day(s)  # of days IP prior to Palliative referral  0  Clinical Assessment  Psychosocial & Spiritual Assessment  Palliative Care Outcomes  Patient/Family meeting held?  Yes  Who was at the meeting?  Patient, Daughter via phone      Patient Active Problem List   Diagnosis Date Noted  . Elevated LFTs Dec 24, 2019  . Palliative care encounter 12-24-2019  . DOE (dyspnea on exertion) 12/24/2019  . Generalized weakness Dec 24, 2019  . Acute on chronic systolic (congestive) heart failure ([place]) 24-Dec-2019  . CKD (chronic kidney disease) stage 3, GFR 30-59 ml/min 12/06/2019  . Acute on chronic systolic CHF (congestive heart failure) ([place]) 12/06/2019  . Atrial fibrillation with RVR ([place]) 12/04/2019  . Chronic kidney disease   . LGI bleed 10/23/2018  . HTN (hypertension) 10/23/2018  . Rectal bleeding   . Acute respiratory failure with hypoxia ([place])   .  AKI (acute kidney injury) ([place])   . Community acquired pneumonia   . Sepsis due to pneumonia ([place]) 06/17/2017  . Acute on chronic respiratory failure with hypoxia ([place]) 06/17/2017  . Urinary tract infection with hematuria 06/17/2017  . Hyponatremia 06/17/2017  . Elevated troponin 06/17/2017  . Acute renal failure (ARF) ([place]) 06/17/2017    Palliative Care Assessment & Plan   Patient Profile: Clayton Hoffman is a 86 year old male with past medical history of heart failure and recent admission for A. fib with RVR who presents with increasing dyspnea and fatigue. Was initially comfort care, but not plan for gentle interventions to see if he improves.  Recommendations/Plan:  Labs worsened and on examination Clayton Hoffman appears to be transitioning to actively dying.  Discussed with his daughter Clayton Hoffman and plan to transition back to full comfort care.  Discussed with administration regarding visitation exceptions for end-of-life care.  Initial discussion regarding potential for residential hospice.  Family would like to see him stabilize prior to considering transfer as he has had abrupt decline over the past 2 days.  Continue to assess stability for transfer to residential hospice over the next 24-48 hours.  Prognosis: Hours to days  Discharge Planning:  To Be Determined- residential hospice vs terminal admission  Care plan was discussed with daughter  Thank you for allowing the Palliative Medicine Team to assist in the care of this patient.   Total Time 30 Prolonged Time Billed No      Greater than 50%  of this time was spent counseling and coordinating care related to the above assessment and plan.  Micheline Rough, MD  Please contact Palliative Medicine Team phone at [phone] for questions and concerns.

## 2020-01-16 NOTE — Care Management Important Message (Signed)
Important Message  Patient Details IM Letter given to [name] RN Case Manager to present to the Patient Name: Clayton Hoffman MRN: [id number] Date of Birth: May 11, 1922   Medicare Important Message Given:  Yes     Caren Macadam 18-Jan-2020, 11:12 AM

## 2020-01-16 DEATH — deceased

## 2021-03-23 IMAGING — DX DG CHEST 1V PORT
2 series · 2 of 2 positions shown · non-contrast
Comparison: Chest radiographs 06/17/2017. Abdominopelvic CT
10/23/2018.

CLINICAL DATA: Chest pain with exertional shortness of breath and
difficulty urinating for 2 weeks. History of atrial fibrillation.

EXAM:
PORTABLE CHEST 1 VIEW

[chest ap (1 of 2)]
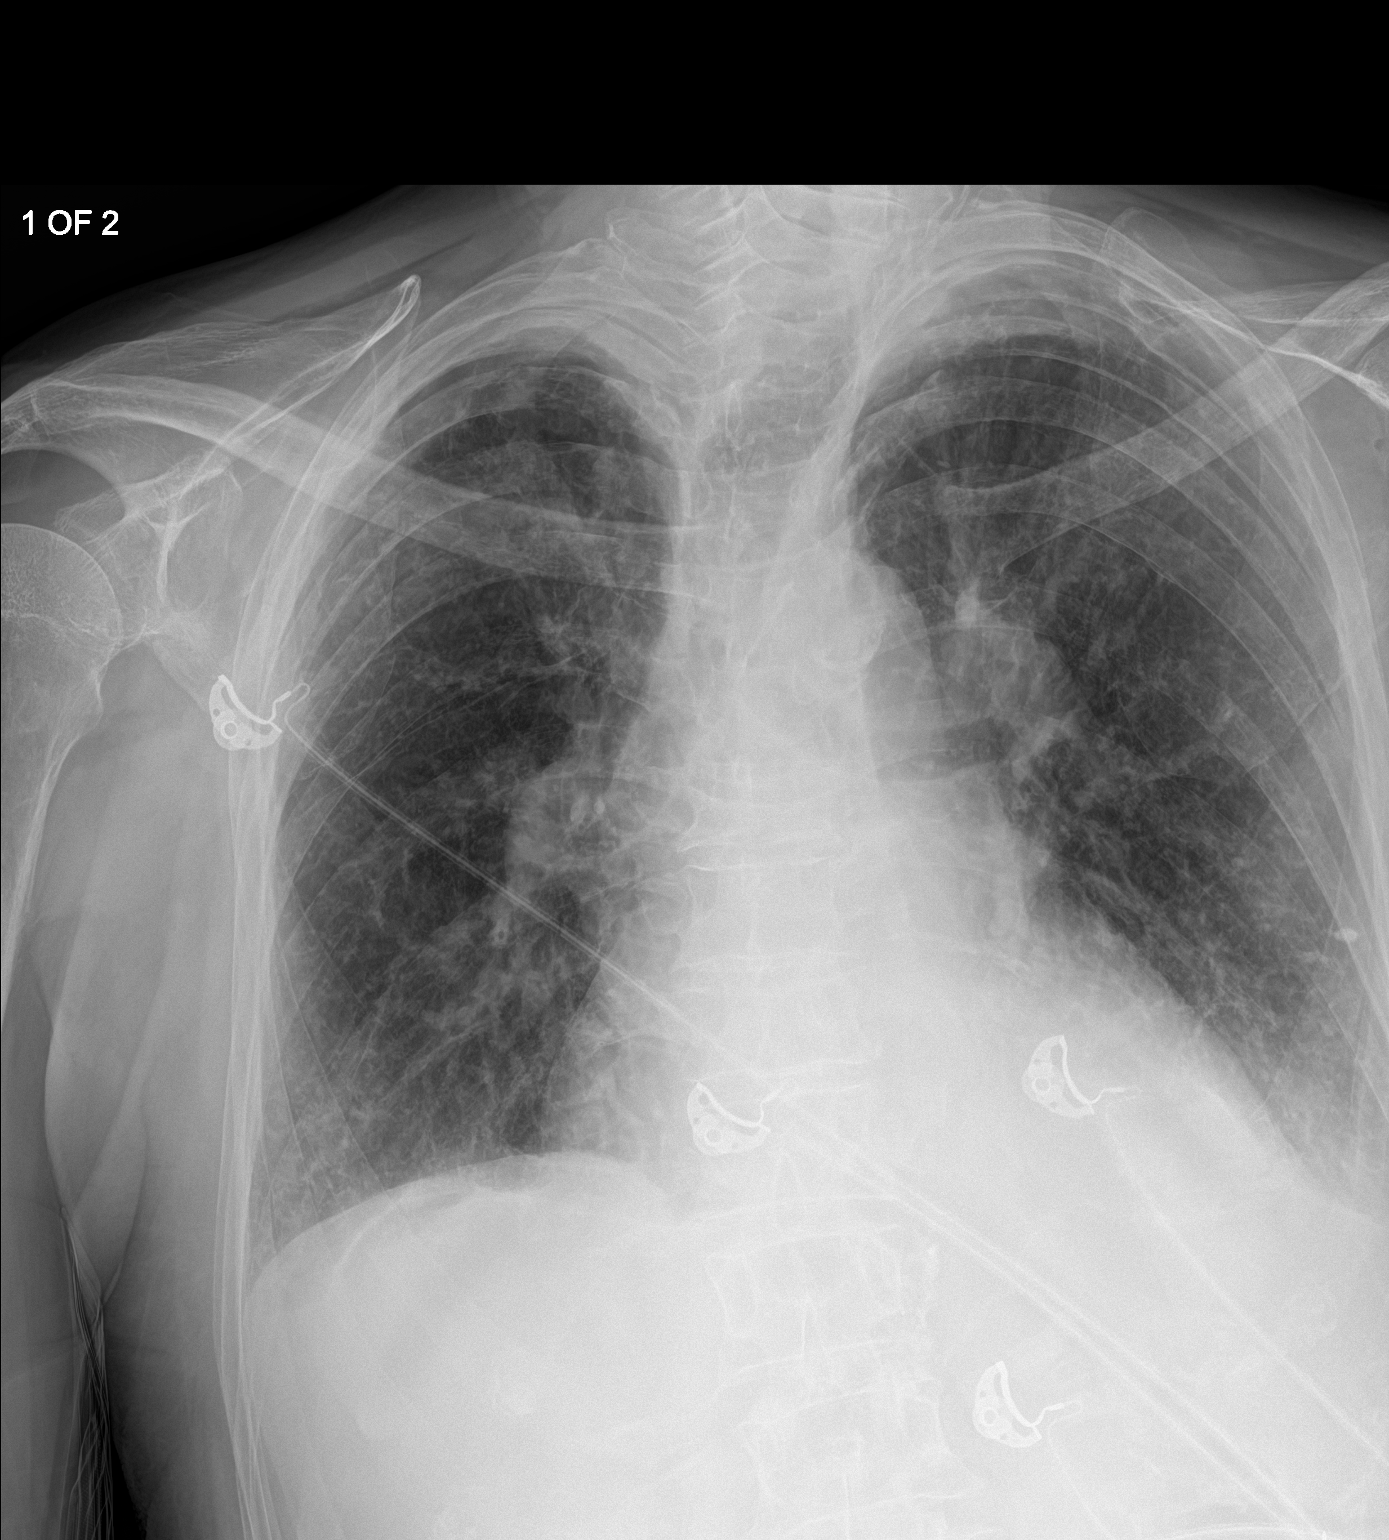

[chest ap (2 of 2)]
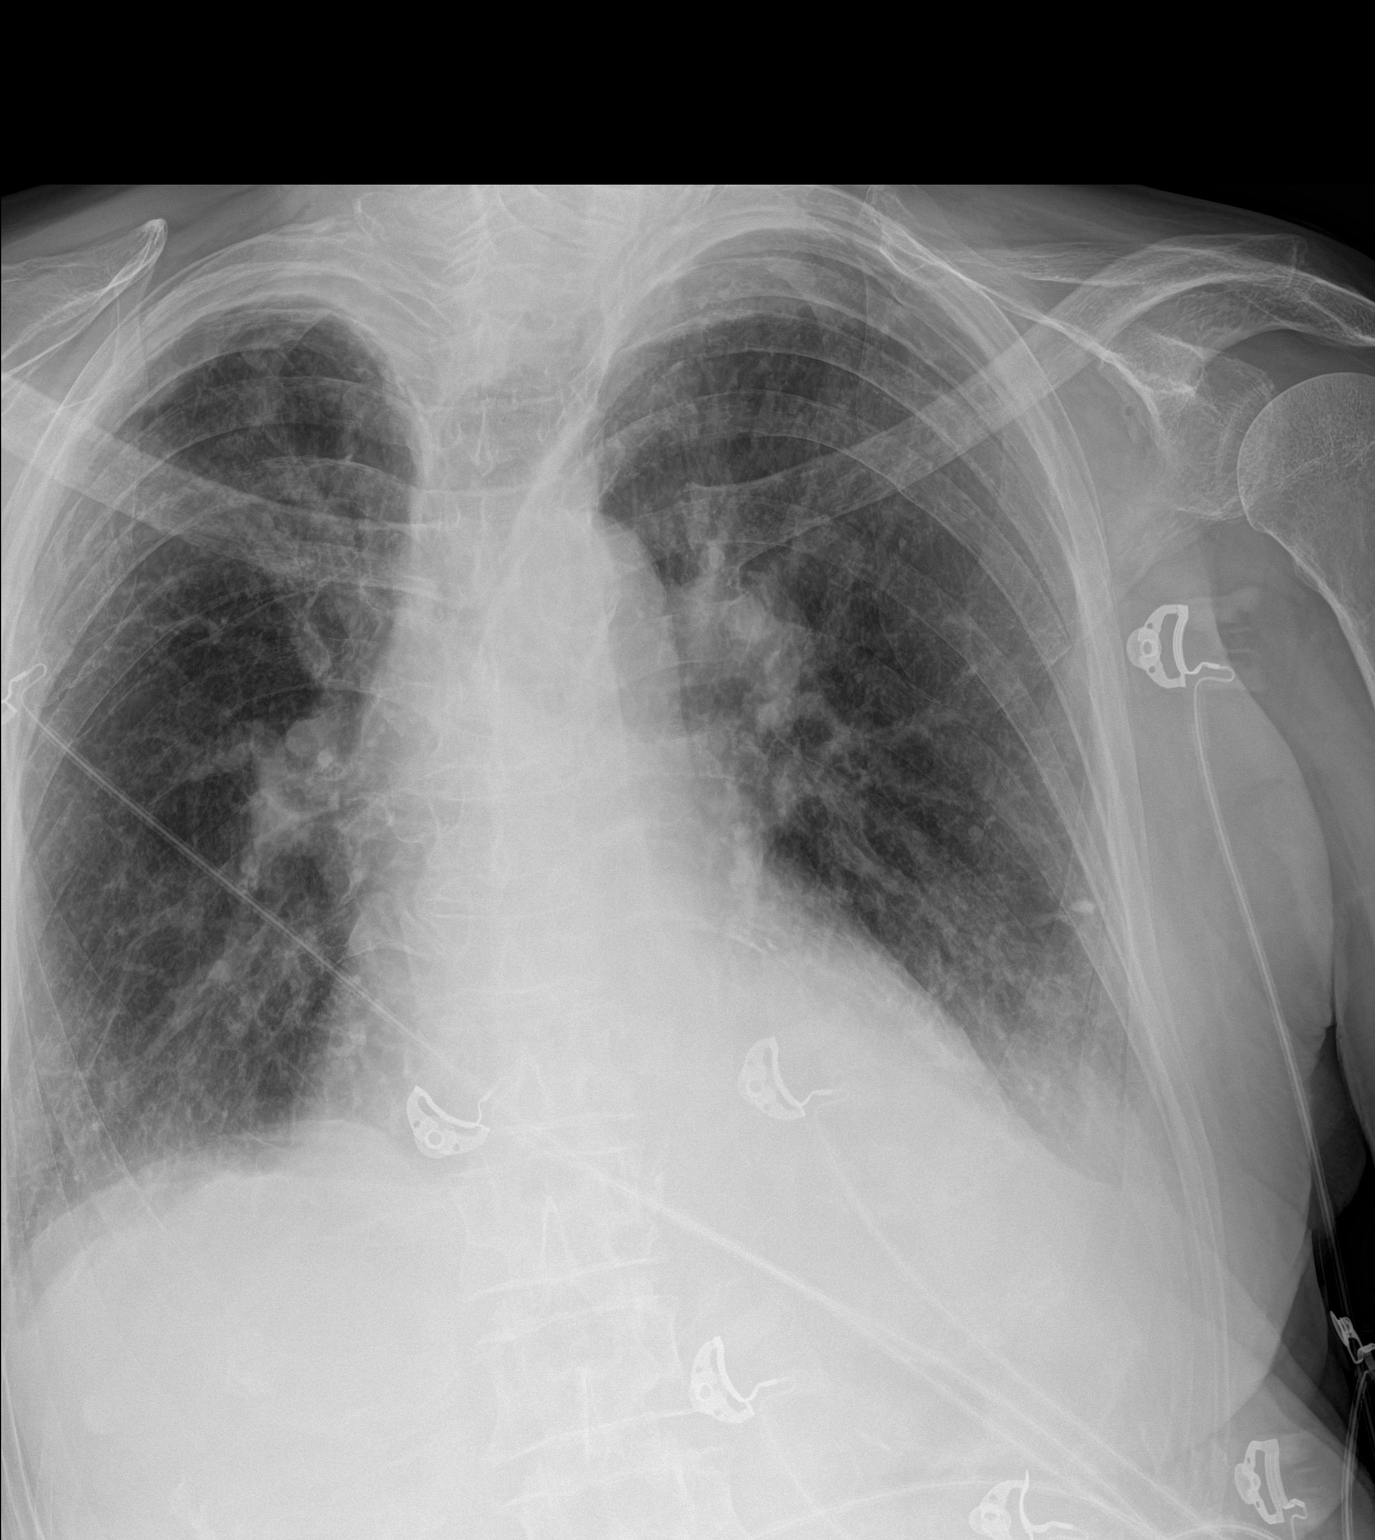

[2 of 2 positions shown; findings below may reference images not displayed]

FINDINGS: 6707 hours. Two views obtained. The heart size and mediastinal
contours are stable with aortic atherosclerosis.
Left-greater-than-right biapical scarring appears stable. The
pleural effusions demonstrated on prior abdominal CT have improved,
although there is a possible small residual/recurrent left pleural
effusion. There is retrocardiac opacity at the left lung base which
appears improved compared with the prior chest radiographs. This
could reflect scarring, atelectasis or an infiltrate. The pulmonary
vasculature is somewhat indistinct without overt pulmonary edema. A
midthoracic compression deformity appears somewhat progressive
compared with prior radiographs.
IMPRESSION: 1. Vascular congestion without overt pulmonary edema.
2. Left basilar pleuroparenchymal opacities appear improved compared
with prior studies and may be chronic versus recurrent airspace
disease and effusion. Radiographic follow up recommended.

## 2021-03-24 IMAGING — DX DG CHEST 1V PORT
1 series · 1 of 1 positions shown · non-contrast
Comparison: December 04, 2019
COMPARISON: December 04, 2019

Addendum:
CLINICAL DATA: Congestive heart failure

EXAM:
PORTABLE CHEST 1 VIEW

[chest]
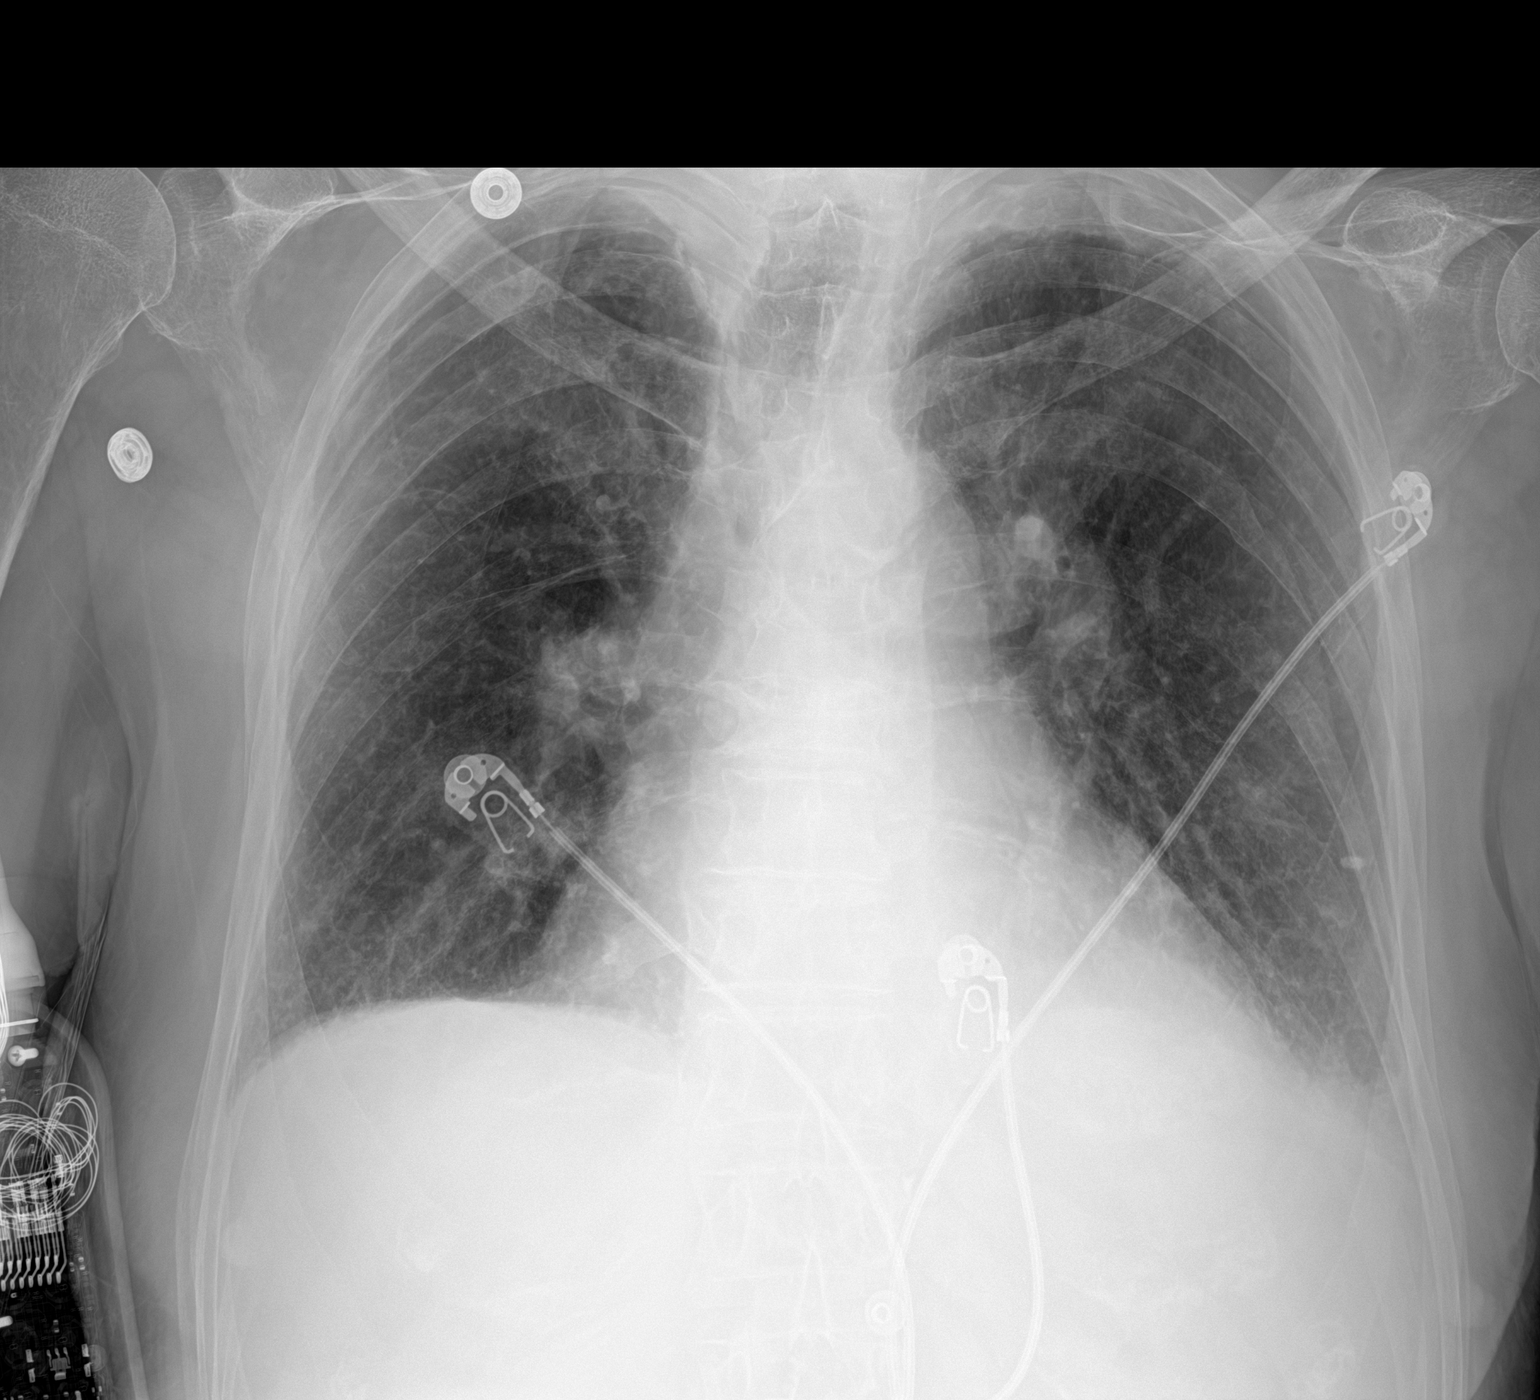

[1 of 1 positions shown; findings below may reference images not displayed]

FINDINGS: There is persistent cardiomegaly with pulmonary vascularity within
normal limits. There is ill-defined opacity in the left base,
stable. There is a rather minimal left pleural effusion. There is
persistent bilateral apical pleural thickening. The interstitium is
thickened with questionable mild degree of interstitial edema. There
is a calcified granuloma in the left lower lobe.

There is aortic atherosclerosis.  No adenopathy.
IMPRESSION: Stable cardiomegaly with pulmonary vascularity within normal limits.
Ill-defined opacity left base appears stable. There may be scarring
in this area. A degree of superimposed pneumonia in this area is
question. There is a minimal left pleural effusion.

Suspect trace interstitial edema. Calcified granuloma left lower
lobe, stable. No new opacity evident. Aortic Atherosclerosis
(O4TGU-CG3.3).

ADDENDUM:
Stable compression of a midthoracic vertebral body.

*** End of Addendum ***
FINDINGS: There is persistent cardiomegaly with pulmonary vascularity within
normal limits. There is ill-defined opacity in the left base,
stable. There is a rather minimal left pleural effusion. There is
persistent bilateral apical pleural thickening. The interstitium is
thickened with questionable mild degree of interstitial edema. There
is a calcified granuloma in the left lower lobe.

There is aortic atherosclerosis.  No adenopathy.
IMPRESSION: Stable cardiomegaly with pulmonary vascularity within normal limits.
Ill-defined opacity left base appears stable. There may be scarring
in this area. A degree of superimposed pneumonia in this area is
question. There is a minimal left pleural effusion.

Suspect trace interstitial edema. Calcified granuloma left lower
lobe, stable. No new opacity evident. Aortic Atherosclerosis
(O4TGU-CG3.3).

## 2021-03-26 IMAGING — DX DG ABD PORTABLE 2V
3 series · 3 of 3 positions shown · non-contrast
Comparison: None.

CLINICAL DATA: [AGE] male with abdominal pain.

EXAM:
PORTABLE ABDOMEN - 2 VIEW

[abdomen erect]
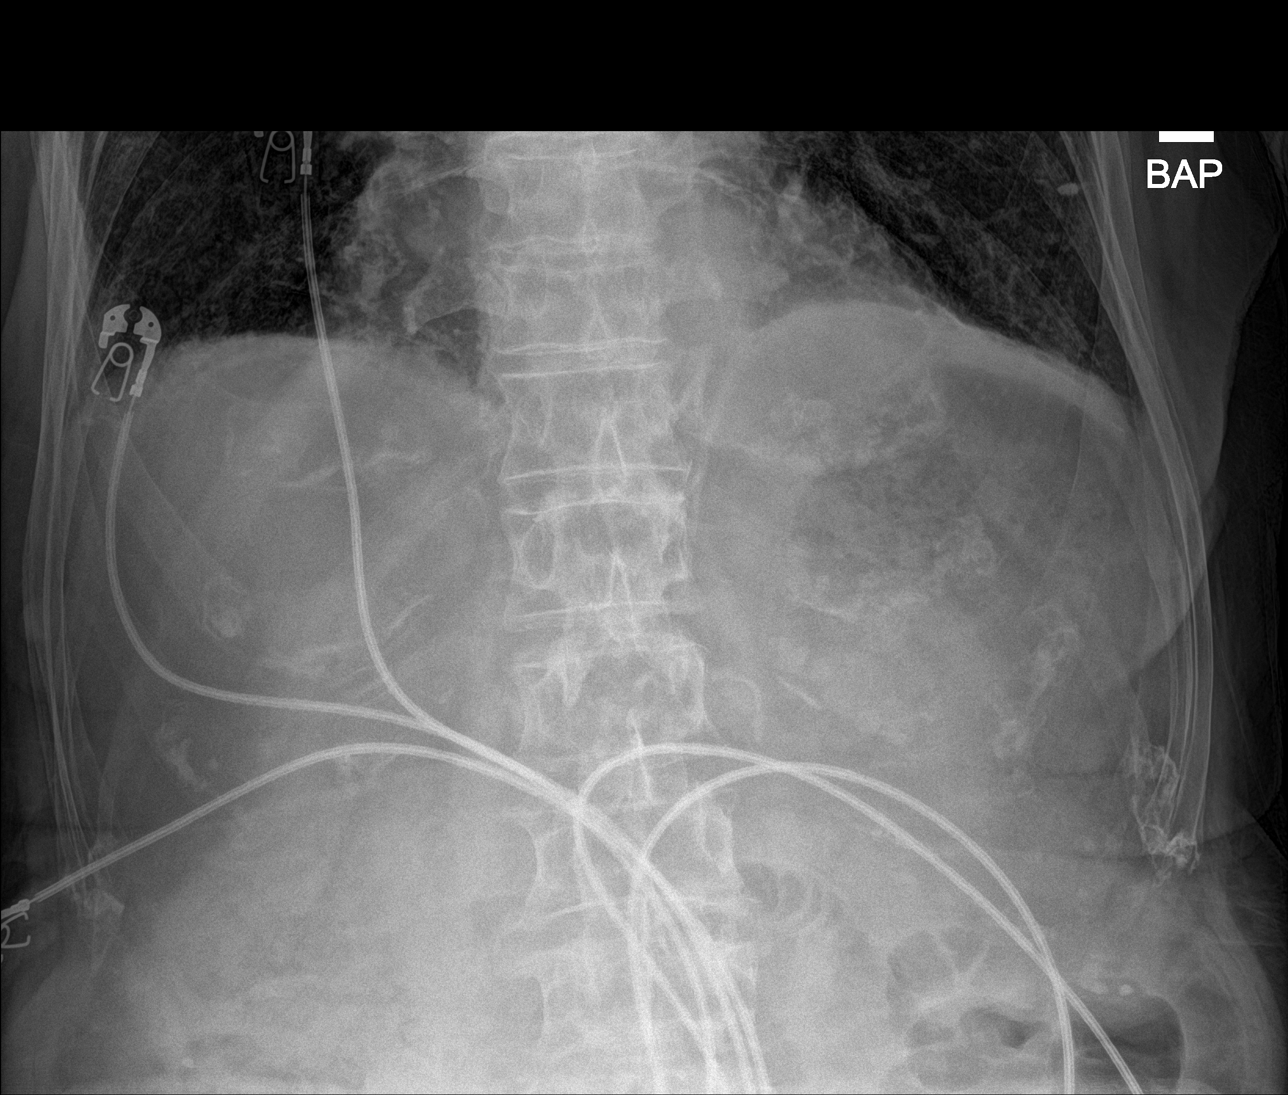

[abdomen supine (1 of 2)]
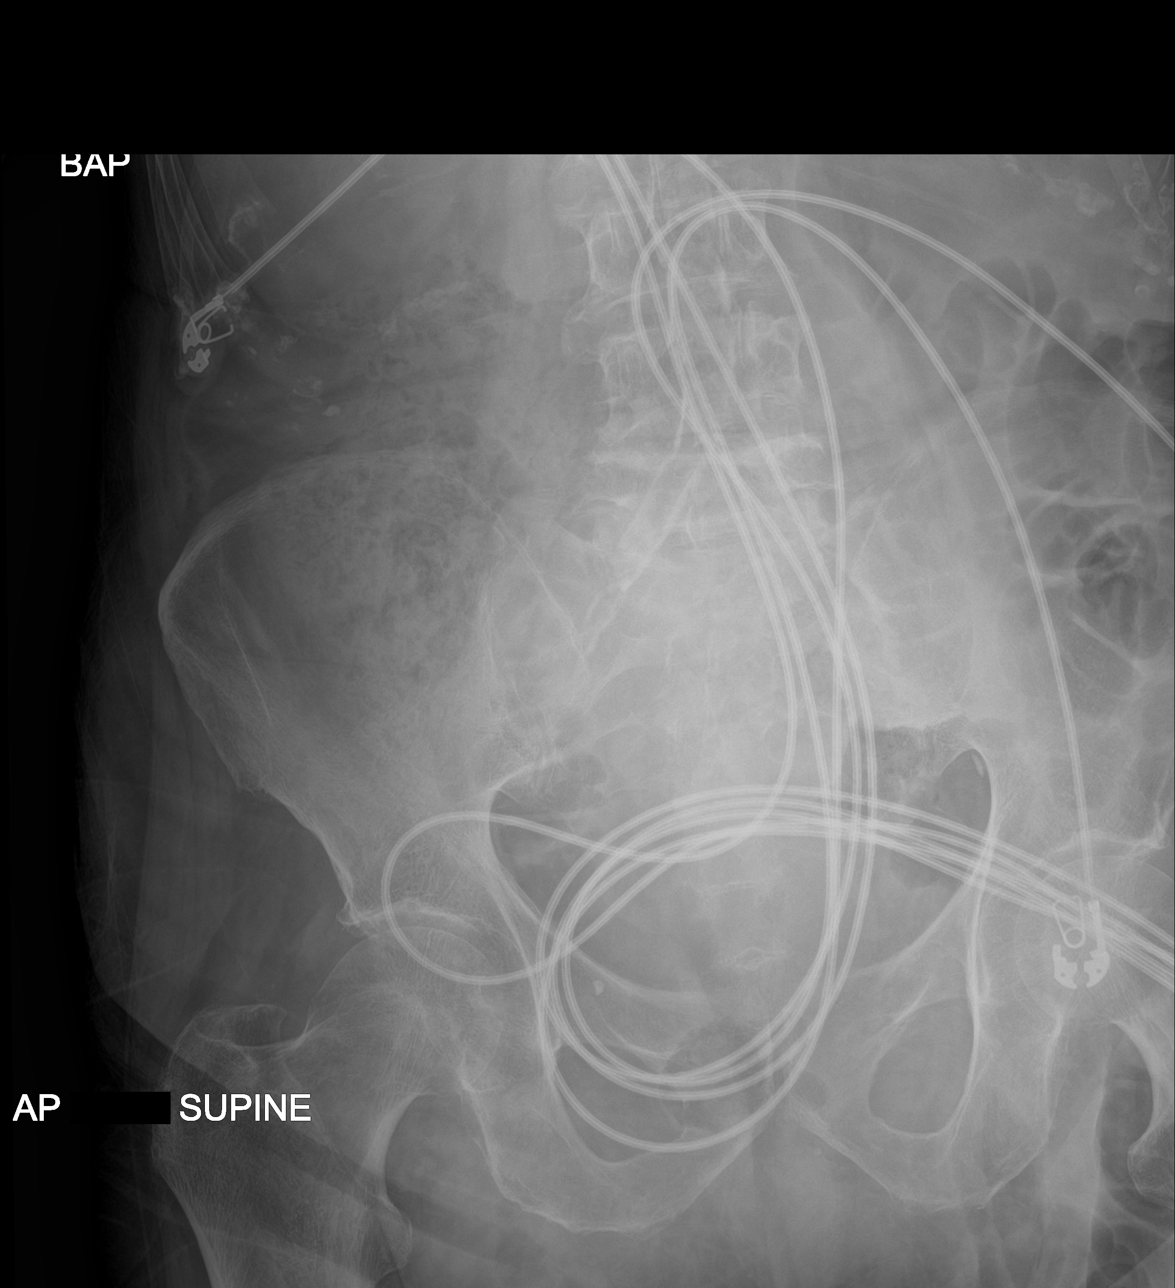

[abdomen supine (2 of 2)]
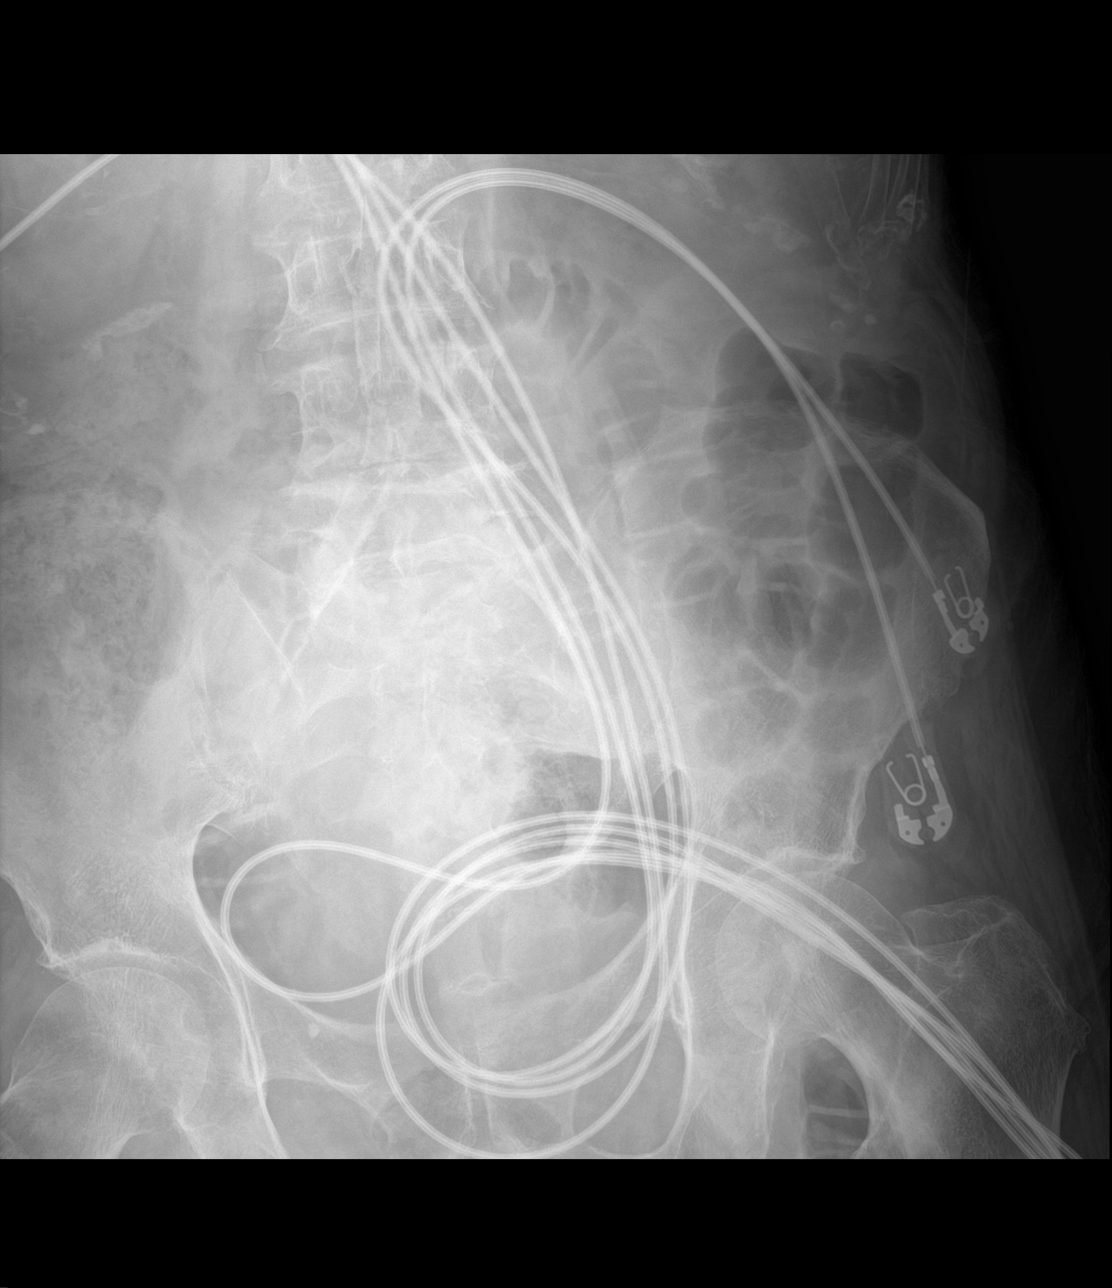

[3 of 3 positions shown; findings below may reference images not displayed]

FINDINGS: There is no bowel dilatation or evidence of obstruction. No definite
free air identified. Atherosclerotic calcification of the aorta.
There is osteopenia with degenerative changes of the spine. No acute
osseous pathology.
IMPRESSION: No bowel dilatation or evidence of obstruction.
